# Patient Record
Sex: Female | Born: 1990 | Race: White | Hispanic: Yes | Marital: Single | Smoking: Never smoker
Health system: Southern US, Community
[De-identification: ages and names within clinical notes are randomized; demographics above are authoritative.]

## PROBLEM LIST (undated history)

## (undated) DIAGNOSIS — A6000 Herpesviral infection of urogenital system, unspecified: Secondary | ICD-10-CM

## (undated) DIAGNOSIS — B009 Herpesviral infection, unspecified: Secondary | ICD-10-CM

## (undated) HISTORY — DX: Herpesviral infection of urogenital system, unspecified: A60.00

## (undated) HISTORY — PX: NO PAST SURGERIES: SHX2092

## (undated) HISTORY — DX: Herpesviral infection, unspecified: B00.9

---

## 2009-12-13 LAB — SICKLE CELL SCREEN: Sickle Cell Screen: NORMAL

## 2009-12-14 ENCOUNTER — Ambulatory Visit (HOSPITAL_COMMUNITY): Admission: RE | Admit: 2009-12-14 | Discharge: 2009-12-14 | Payer: Self-pay | Admitting: Obstetrics & Gynecology

## 2010-01-17 ENCOUNTER — Inpatient Hospital Stay (HOSPITAL_COMMUNITY): Admission: AD | Admit: 2010-01-17 | Discharge: 2010-01-19 | Payer: Self-pay | Admitting: Obstetrics & Gynecology

## 2010-01-17 ENCOUNTER — Ambulatory Visit: Payer: Self-pay | Admitting: Family Medicine

## 2010-10-21 LAB — WET PREP, GENITAL
Trich, Wet Prep: NONE SEEN
Yeast Wet Prep HPF POC: NONE SEEN

## 2010-10-21 LAB — CBC
HCT: 38.4 % (ref 36.0–46.0)
MCV: 91.5 fL (ref 78.0–100.0)

## 2010-10-21 LAB — RPR: RPR Ser Ql: NONREACTIVE

## 2011-10-21 LAB — CYTOLOGY - PAP: Pap: NEGATIVE

## 2013-07-08 ENCOUNTER — Other Ambulatory Visit (HOSPITAL_COMMUNITY): Payer: Self-pay | Admitting: Nurse Practitioner

## 2013-07-08 DIAGNOSIS — Z3689 Encounter for other specified antenatal screening: Secondary | ICD-10-CM

## 2013-07-08 LAB — OB RESULTS CONSOLE HEPATITIS B SURFACE ANTIGEN: Hepatitis B Surface Ag: NEGATIVE

## 2013-07-08 LAB — OB RESULTS CONSOLE GC/CHLAMYDIA: Gonorrhea: NEGATIVE

## 2013-07-08 LAB — OB RESULTS CONSOLE ANTIBODY SCREEN: Antibody Screen: NEGATIVE

## 2013-07-08 LAB — OB RESULTS CONSOLE RUBELLA ANTIBODY, IGM: RUBELLA: IMMUNE

## 2013-07-08 LAB — OB RESULTS CONSOLE HIV ANTIBODY (ROUTINE TESTING): HIV: NONREACTIVE

## 2013-07-08 LAB — GLUCOSE, 1 HOUR: Glucose, 1 hour: 57

## 2013-07-08 LAB — CULTURE, OB URINE: Urine Culture, OB: NEGATIVE

## 2013-07-08 LAB — OB RESULTS CONSOLE HGB/HCT, BLOOD: HCT: 41 %

## 2013-07-15 ENCOUNTER — Encounter: Payer: Self-pay | Admitting: Obstetrics and Gynecology

## 2013-07-26 ENCOUNTER — Ambulatory Visit (HOSPITAL_COMMUNITY)
Admission: RE | Admit: 2013-07-26 | Discharge: 2013-07-26 | Disposition: A | Discharge status: Home / Self Care | Payer: Medicaid Other | Source: Ambulatory Visit | Attending: Nurse Practitioner | Admitting: Nurse Practitioner

## 2013-07-26 DIAGNOSIS — O365990 Maternal care for other known or suspected poor fetal growth, unspecified trimester, not applicable or unspecified: Secondary | ICD-10-CM | POA: Insufficient documentation

## 2013-07-26 DIAGNOSIS — Z3689 Encounter for other specified antenatal screening: Secondary | ICD-10-CM | POA: Insufficient documentation

## 2013-08-02 ENCOUNTER — Other Ambulatory Visit (HOSPITAL_COMMUNITY): Payer: Self-pay | Admitting: Physician Assistant

## 2013-08-02 DIAGNOSIS — Z3689 Encounter for other specified antenatal screening: Secondary | ICD-10-CM

## 2013-08-02 LAB — OB RESULTS CONSOLE GC/CHLAMYDIA
CHLAMYDIA, DNA PROBE: NEGATIVE
Chlamydia: NEGATIVE
Gonorrhea: NEGATIVE

## 2013-08-05 NOTE — L&D Delivery Note (Signed)
Delivery Note Pt pushed well and at 6:38 AM a viable female was delivered via Vaginal, Spontaneous Delivery (Presentation: ; Occiput Anterior).  APGAR: 9, 9; weight: pending.  Infant dried and lifted to pt's abd. Cord clamped and cut by FOB. Hospital lab blood sample collected from cord.  Placenta status: Intact, Spontaneous.  Cord: 3 vessels  Anesthesia: None  Episiotomy: None Lacerations: None Est. Blood Loss (mL): 300  Mom to postpartum.  Baby to Couplet care / Skin to Skin.  Cam HaiSHAW, Averie Meiner CNM 01/15/2014, 7:17 AM

## 2013-08-05 NOTE — L&D Delivery Note (Signed)
Attestation of Attending Supervision of Advanced Practitioner (PA/CNM/NP): Evaluation and management procedures were performed by the Advanced Practitioner under my supervision and collaboration.  I have reviewed the Advanced Practitioner's note and chart, and I agree with the management and plan.  Dayton Kenley, MD, FACOG Attending Obstetrician & Gynecologist Faculty Practice, [facility]  

## 2013-08-09 ENCOUNTER — Encounter: Payer: Self-pay | Admitting: *Deleted

## 2013-08-18 ENCOUNTER — Encounter: Payer: Self-pay | Admitting: General Practice

## 2013-08-19 ENCOUNTER — Encounter: Payer: Self-pay | Admitting: Family Medicine

## 2013-08-19 ENCOUNTER — Ambulatory Visit (INDEPENDENT_AMBULATORY_CARE_PROVIDER_SITE_OTHER): Payer: Medicaid Other | Admitting: Family Medicine

## 2013-08-19 VITALS — BP 104/56 | Temp 97.8°F | Wt 91.1 lb

## 2013-08-19 DIAGNOSIS — O0990 Supervision of high risk pregnancy, unspecified, unspecified trimester: Secondary | ICD-10-CM | POA: Insufficient documentation

## 2013-08-19 DIAGNOSIS — O09299 Supervision of pregnancy with other poor reproductive or obstetric history, unspecified trimester: Secondary | ICD-10-CM | POA: Insufficient documentation

## 2013-08-19 DIAGNOSIS — Z3490 Encounter for supervision of normal pregnancy, unspecified, unspecified trimester: Secondary | ICD-10-CM | POA: Insufficient documentation

## 2013-08-19 LAB — POCT URINALYSIS DIP (DEVICE)
BILIRUBIN URINE: NEGATIVE
Glucose, UA: NEGATIVE mg/dL
HGB URINE DIPSTICK: NEGATIVE
Ketones, ur: NEGATIVE mg/dL
Nitrite: NEGATIVE
Protein, ur: NEGATIVE mg/dL
Specific Gravity, Urine: 1.025 (ref 1.005–1.030)
UROBILINOGEN UA: 0.2 mg/dL (ref 0.0–1.0)
pH: 6 (ref 5.0–8.0)

## 2013-08-19 NOTE — Progress Notes (Signed)
Pulse- 66 New ob packet given [facility]

## 2013-08-19 NOTE — Patient Instructions (Signed)
Second Trimester of Pregnancy The second trimester is from week 13 through week 28, months 4 through 6. The second trimester is often a time when you feel your best. Your body has also adjusted to being pregnant, and you begin to feel better physically. Usually, morning sickness has lessened or quit completely, you may have more energy, and you may have an increase in appetite. The second trimester is also a time when the fetus is growing rapidly. At the end of the sixth month, the fetus is about 9 inches long and weighs about 1 pounds. You will likely begin to feel the baby move (quickening) between 18 and 20 weeks of the pregnancy. BODY CHANGES Your body goes through many changes during pregnancy. The changes vary from woman to woman.   Your weight will continue to increase. You will notice your lower abdomen bulging out.  You may begin to get stretch marks on your hips, abdomen, and breasts.  You may develop headaches that can be relieved by medicines approved by your caregiver.  You may urinate more often because the fetus is pressing on your bladder.  You may develop or continue to have heartburn as a result of your pregnancy.  You may develop constipation because certain hormones are causing the muscles that push waste through your intestines to slow down.  You may develop hemorrhoids or swollen, bulging veins (varicose veins).  You may have back pain because of the weight gain and pregnancy hormones relaxing your joints between the bones in your pelvis and as a result of a shift in weight and the muscles that support your balance.  Your breasts will continue to grow and be tender.  Your gums may bleed and may be sensitive to brushing and flossing.  Dark spots or blotches (chloasma, mask of pregnancy) may develop on your face. This will likely fade after the baby is born.  A dark line from your belly button to the pubic area (linea nigra) may appear. This will likely fade after  the baby is born. WHAT TO EXPECT AT YOUR PRENATAL VISITS During a routine prenatal visit:  You will be weighed to make sure you and the fetus are growing normally.  Your blood pressure will be taken.  Your abdomen will be measured to track your baby's growth.  The fetal heartbeat will be listened to.  Any test results from the previous visit will be discussed. Your caregiver may ask you:  How you are feeling.  If you are feeling the baby move.  If you have had any abnormal symptoms, such as leaking fluid, bleeding, severe headaches, or abdominal cramping.  If you have any questions. Other tests that may be performed during your second trimester include:  Blood tests that check for:  Low iron levels (anemia).  Gestational diabetes (between 24 and 28 weeks).  Rh antibodies.  Urine tests to check for infections, diabetes, or protein in the urine.  An ultrasound to confirm the proper growth and development of the baby.  An amniocentesis to check for possible genetic problems.  Fetal screens for spina bifida and Down syndrome. HOME CARE INSTRUCTIONS   Avoid all smoking, herbs, alcohol, and unprescribed drugs. These chemicals affect the formation and growth of the baby.  Follow your caregiver's instructions regarding medicine use. There are medicines that are either safe or unsafe to take during pregnancy.  Exercise only as directed by your caregiver. Experiencing uterine cramps is a good sign to stop exercising.  Continue to eat regular,   healthy meals.  Wear a good support bra for breast tenderness.  Do not use hot tubs, steam rooms, or saunas.  Wear your seat belt at all times when driving.  Avoid raw meat, uncooked cheese, cat litter boxes, and soil used by cats. These carry germs that can cause birth defects in the baby.  Take your prenatal vitamins.  Try taking a stool softener (if your caregiver approves) if you develop constipation. Eat more high-fiber  foods, such as fresh vegetables or fruit and whole grains. Drink plenty of fluids to keep your urine clear or pale yellow.  Take warm sitz baths to soothe any pain or discomfort caused by hemorrhoids. Use hemorrhoid cream if your caregiver approves.  If you develop varicose veins, wear support hose. Elevate your feet for 15 minutes, 3 4 times a day. Limit salt in your diet.  Avoid heavy lifting, wear low heel shoes, and practice good posture.  Rest with your legs elevated if you have leg cramps or low back pain.  Visit your dentist if you have not gone yet during your pregnancy. Use a soft toothbrush to brush your teeth and be gentle when you floss.  A sexual relationship may be continued unless your caregiver directs you otherwise.  Continue to go to all your prenatal visits as directed by your caregiver. SEEK MEDICAL CARE IF:   You have dizziness.  You have mild pelvic cramps, pelvic pressure, or nagging pain in the abdominal area.  You have persistent nausea, vomiting, or diarrhea.  You have a bad smelling vaginal discharge.  You have pain with urination. SEEK IMMEDIATE MEDICAL CARE IF:   You have a fever.  You are leaking fluid from your vagina.  You have spotting or bleeding from your vagina.  You have severe abdominal cramping or pain.  You have rapid weight gain or loss.  You have shortness of breath with chest pain.  You notice sudden or extreme swelling of your face, hands, ankles, feet, or legs.  You have not felt your baby move in over an hour.  You have severe headaches that do not go away with medicine.  You have vision changes. Document Released: 07/16/2001 Document Revised: 03/24/2013 Document Reviewed: 09/22/2012 [employer] Patient Information 2014 [employer].  Breastfeeding Deciding to breastfeed is one of the best choices you can make for you and your baby. A change in hormones during pregnancy causes your breast tissue to grow and increases the  number and size of your milk ducts. These hormones also allow proteins, sugars, and fats from your blood supply to make breast milk in your milk-producing glands. Hormones prevent breast milk from being released before your baby is born as well as prompt milk flow after birth. Once breastfeeding has begun, thoughts of your baby, as well as his or her sucking or crying, can stimulate the release of milk from your milk-producing glands.  BENEFITS OF BREASTFEEDING For Your Baby  Your first milk (colostrum) helps your baby's digestive system function better.   There are antibodies in your milk that help your baby fight off infections.   Your baby has a lower incidence of asthma, allergies, and sudden infant death syndrome.   The nutrients in breast milk are better for your baby than infant formulas and are designed uniquely for your baby's needs.   Breast milk improves your baby's brain development.   Your baby is less likely to develop other conditions, such as childhood obesity, asthma, or type 2 diabetes mellitus.  For   You   Breastfeeding helps to create a very special bond between you and your baby.   Breastfeeding is convenient. Breast milk is always available at the correct temperature and costs nothing.   Breastfeeding helps to burn calories and helps you lose the weight gained during pregnancy.   Breastfeeding makes your uterus contract to its prepregnancy size faster and slows bleeding (lochia) after you give birth.   Breastfeeding helps to lower your risk of developing type 2 diabetes mellitus, osteoporosis, and breast or ovarian cancer later in life. SIGNS THAT YOUR BABY IS HUNGRY Early Signs of Hunger  Increased alertness or activity.  Stretching.  Movement of the head from side to side.  Movement of the head and opening of the mouth when the corner of the mouth or cheek is stroked (rooting).  Increased sucking sounds, smacking lips, cooing, sighing, or  squeaking.  Hand-to-mouth movements.  Increased sucking of fingers or hands. Late Signs of Hunger  Fussing.  Intermittent crying. Extreme Signs of Hunger Signs of extreme hunger will require calming and consoling before your baby will be able to breastfeed successfully. Do not wait for the following signs of extreme hunger to occur before you initiate breastfeeding:   Restlessness.  A loud, strong cry.   Screaming. BREASTFEEDING BASICS Breastfeeding Initiation  Find a comfortable place to sit or lie down, with your neck and back well supported.  Place a pillow or rolled up blanket under your baby to bring him or her to the level of your breast (if you are seated). Nursing pillows are specially designed to help support your arms and your baby while you breastfeed.  Make sure that your baby's abdomen is facing your abdomen.   Gently massage your breast. With your fingertips, massage from your chest wall toward your nipple in a circular motion. This encourages milk flow. You may need to continue this action during the feeding if your milk flows slowly.  Support your breast with 4 fingers underneath and your thumb above your nipple. Make sure your fingers are well away from your nipple and your baby's mouth.   Stroke your baby's lips gently with your finger or nipple.   When your baby's mouth is open wide enough, quickly bring your baby to your breast, placing your entire nipple and as much of the colored area around your nipple (areola) as possible into your baby's mouth.   More areola should be visible above your baby's upper lip than below the lower lip.   Your baby's tongue should be between his or her lower gum and your breast.   Ensure that your baby's mouth is correctly positioned around your nipple (latched). Your baby's lips should create a seal on your breast and be turned out (everted).  It is common for your baby to suck about 2 3 minutes in order to start the  flow of breast milk. Latching Teaching your baby how to latch on to your breast properly is very important. An improper latch can cause nipple pain and decreased milk supply for you and poor weight gain in your baby. Also, if your baby is not latched onto your nipple properly, he or she may swallow some air during feeding. This can make your baby fussy. Burping your baby when you switch breasts during the feeding can help to get rid of the air. However, teaching your baby to latch on properly is still the best way to prevent fussiness from swallowing air while breastfeeding. Signs that your baby has   successfully latched on to your nipple:    Silent tugging or silent sucking, without causing you pain.   Swallowing heard between every 3 4 sucks.    Muscle movement above and in front of his or her ears while sucking.  Signs that your baby has not successfully latched on to nipple:   Sucking sounds or smacking sounds from your baby while breastfeeding.  Nipple pain. If you think your baby has not latched on correctly, slip your finger into the corner of your baby's mouth to break the suction and place it between your baby's gums. Attempt breastfeeding initiation again. Signs of Successful Breastfeeding Signs from your baby:   A gradual decrease in the number of sucks or complete cessation of sucking.   Falling asleep.   Relaxation of his or her body.   Retention of a small amount of milk in his or her mouth.   Letting go of your breast by himself or herself. Signs from you:  Breasts that have increased in firmness, weight, and size 1 3 hours after feeding.   Breasts that are softer immediately after breastfeeding.  Increased milk volume, as well as a change in milk consistency and color by the 5th day of breastfeeding.   Nipples that are not sore, cracked, or bleeding. Signs That Your Baby is Getting Enough Milk  Wetting at least 3 diapers in a 24-hour period. The urine  should be clear and pale yellow by age 5 days.  At least 3 stools in a 24-hour period by age 5 days. The stool should be soft and yellow.  At least 3 stools in a 24-hour period by age 7 days. The stool should be seedy and yellow.  No loss of weight greater than 10% of birth weight during the first 3 days of age.  Average weight gain of 4 7 ounces (120 210 mL) per week after age 4 days.  Consistent daily weight gain by age 5 days, without weight loss after the age of 2 weeks. After a feeding, your baby may spit up a small amount. This is common. BREASTFEEDING FREQUENCY AND DURATION Frequent feeding will help you make more milk and can prevent sore nipples and breast engorgement. Breastfeed when you feel the need to reduce the fullness of your breasts or when your baby shows signs of hunger. This is called "breastfeeding on demand." Avoid introducing a pacifier to your baby while you are working to establish breastfeeding (the first 4 6 weeks after your baby is born). After this time you may choose to use a pacifier. Research has shown that pacifier use during the first year of a baby's life decreases the risk of sudden infant death syndrome (SIDS). Allow your baby to feed on each breast as long as he or she wants. Breastfeed until your baby is finished feeding. When your baby unlatches or falls asleep while feeding from the first breast, offer the second breast. Because newborns are often sleepy in the first few weeks of life, you may need to awaken your baby to get him or her to feed. Breastfeeding times will vary from baby to baby. However, the following rules can serve as a guide to help you ensure that your baby is properly fed:  Newborns (babies 4 weeks of age or younger) may breastfeed every 1 3 hours.  Newborns should not go longer than 3 hours during the day or 5 hours during the night without breastfeeding.  You should breastfeed your baby a minimum of   8 times in a 24-hour period until  you begin to introduce solid foods to your baby at around 6 months of age. BREAST MILK PUMPING Pumping and storing breast milk allows you to ensure that your baby is exclusively fed your breast milk, even at times when you are unable to breastfeed. This is especially important if you are going back to work while you are still breastfeeding or when you are not able to be present during feedings. Your lactation consultant can give you guidelines on how long it is safe to store breast milk.  A breast pump is a machine that allows you to pump milk from your breast into a sterile bottle. The pumped breast milk can then be stored in a refrigerator or freezer. Some breast pumps are operated by hand, while others use electricity. Ask your lactation consultant which type will work best for you. Breast pumps can be purchased, but some hospitals and breastfeeding support groups lease breast pumps on a monthly basis. A lactation consultant can teach you how to hand express breast milk, if you prefer not to use a pump.  CARING FOR YOUR BREASTS WHILE YOU BREASTFEED Nipples can become dry, cracked, and sore while breastfeeding. The following recommendations can help keep your breasts moisturized and healthy:  Avoid using soap on your nipples.   Wear a supportive bra. Although not required, special nursing bras and tank tops are designed to allow access to your breasts for breastfeeding without taking off your entire bra or top. Avoid wearing underwire style bras or extremely tight bras.  Air dry your nipples for 3 4minutes after each feeding.   Use only cotton bra pads to absorb leaked breast milk. Leaking of breast milk between feedings is normal.   Use lanolin on your nipples after breastfeeding. Lanolin helps to maintain your skin's normal moisture barrier. If you use pure lanolin you do not need to wash it off before feeding your baby again. Pure lanolin is not toxic to your baby. You may also hand express a  few drops of breast milk and gently massage that milk into your nipples and allow the milk to air dry. In the first few weeks after giving birth, some women experience extremely full breasts (engorgement). Engorgement can make your breasts feel heavy, warm, and tender to the touch. Engorgement peaks within 3 5 days after you give birth. The following recommendations can help ease engorgement:  Completely empty your breasts while breastfeeding or pumping. You may want to start by applying warm, moist heat (in the shower or with warm water-soaked hand towels) just before feeding or pumping. This increases circulation and helps the milk flow. If your baby does not completely empty your breasts while breastfeeding, pump any extra milk after he or she is finished.  Wear a snug bra (nursing or regular) or tank top for 1 2 days to signal your body to slightly decrease milk production.  Apply ice packs to your breasts, unless this is too uncomfortable for you.  Make sure that your baby is latched on and positioned properly while breastfeeding. If engorgement persists after 48 hours of following these recommendations, contact your health care provider or a lactation consultant. OVERALL HEALTH CARE RECOMMENDATIONS WHILE BREASTFEEDING  Eat healthy foods. Alternate between meals and snacks, eating 3 of each per day. Because what you eat affects your breast milk, some of the foods may make your baby more irritable than usual. Avoid eating these foods if you are sure that they are   negatively affecting your baby.  Drink milk, fruit juice, and water to satisfy your thirst (about 10 glasses a day).   Rest often, relax, and continue to take your prenatal vitamins to prevent fatigue, stress, and anemia.  Continue breast self-awareness checks.  Avoid chewing and smoking tobacco.  Avoid alcohol and drug use. Some medicines that may be harmful to your baby can pass through breast milk. It is important to ask your  health care provider before taking any medicine, including all over-the-counter and prescription medicine as well as vitamin and herbal supplements. It is possible to become pregnant while breastfeeding. If birth control is desired, ask your health care provider about options that will be safe for your baby. SEEK MEDICAL CARE IF:   You feel like you want to stop breastfeeding or have become frustrated with breastfeeding.  You have painful breasts or nipples.  Your nipples are cracked or bleeding.  Your breasts are red, tender, or warm.  You have a swollen area on either breast.  You have a fever or chills.  You have nausea or vomiting.  You have drainage other than breast milk from your nipples.  Your breasts do not become full before feedings by the 5th day after you give birth.  You feel sad and depressed.  Your baby is too sleepy to eat well.  Your baby is having trouble sleeping.   Your baby is wetting less than 3 diapers in a 24-hour period.  Your baby has less than 3 stools in a 24-hour period.  Your baby's skin or the white part of his or her eyes becomes yellow.   Your baby is not gaining weight by 5 days of age. SEEK IMMEDIATE MEDICAL CARE IF:   Your baby is overly tired (lethargic) and does not want to wake up and feed.  Your baby develops an unexplained fever. Document Released: 07/22/2005 Document Revised: 03/24/2013 Document Reviewed: 01/13/2013 [employer] Patient Information 2014 [employer].  

## 2013-08-19 NOTE — Progress Notes (Signed)
Nutrition note: 1st visit consult Pt has gained 4.1# @ 5726w5d, which is < expected so far. Pt reports eating 3 meals & 5-6 snacks/d (mostly fruit). Pt is taking PNV. Pt reports no N/V or heartburn. NKFA Pt is not doing any physical activity. Pt received verbal & written education on general nutrition during pregnancy. Provided handout on energy dense snacks & encouraged pt to include a protein source with all meals & snacks. Discussed wt gain goals of 25-40# or 1#/wk. Pt agrees to continue taking PNV and try to eat more energy dense snacks. Pt has WIC and plans to BF. F/u in 4-6 wks Blondell RevealLaura Yaziel Brandon, MS, RD, LDN, [facility]

## 2013-08-19 NOTE — Progress Notes (Signed)
From [facility] for h/o SGA.--probably constitutional--mom is small   Quad screen today. F/u antomy scheduled for 08/27/13

## 2013-08-23 ENCOUNTER — Encounter: Payer: Self-pay | Admitting: Family Medicine

## 2013-08-23 LAB — AFP, QUAD SCREEN
AFP: 116 [IU]/mL
Age Alone: 1:1080 {titer}
CURR GEST AGE: 18.5 wks.days
HCG, Total: 19984 m[IU]/mL
INH: 172.1 pg/mL
INTERPRETATION-AFP: NEGATIVE
MOM FOR AFP: 1.91
MOM FOR HCG: 1
MoM for INH: 0.93
OPEN SPINA BIFIDA: NEGATIVE
TRI 18 SCR RISK EST: NEGATIVE
Trisomy 18 (Edward) Syndrome Interp.: 1:56100 {titer}
UE3 MOM: 0.78
UE3 VALUE: 1.2 ng/mL

## 2013-08-25 ENCOUNTER — Encounter: Payer: Self-pay | Admitting: *Deleted

## 2013-08-27 ENCOUNTER — Ambulatory Visit (HOSPITAL_COMMUNITY)
Admission: RE | Admit: 2013-08-27 | Discharge: 2013-08-27 | Disposition: A | Discharge status: Home / Self Care | Payer: Medicaid Other | Source: Ambulatory Visit | Attending: Physician Assistant | Admitting: Physician Assistant

## 2013-08-27 DIAGNOSIS — Z3689 Encounter for other specified antenatal screening: Secondary | ICD-10-CM | POA: Insufficient documentation

## 2013-09-16 ENCOUNTER — Ambulatory Visit (INDEPENDENT_AMBULATORY_CARE_PROVIDER_SITE_OTHER): Payer: Medicaid Other | Admitting: Obstetrics and Gynecology

## 2013-09-16 ENCOUNTER — Encounter: Payer: Self-pay | Admitting: Obstetrics and Gynecology

## 2013-09-16 VITALS — BP 92/50 | Temp 97.8°F | Wt 93.9 lb

## 2013-09-16 DIAGNOSIS — O09299 Supervision of pregnancy with other poor reproductive or obstetric history, unspecified trimester: Secondary | ICD-10-CM

## 2013-09-16 DIAGNOSIS — O0990 Supervision of high risk pregnancy, unspecified, unspecified trimester: Secondary | ICD-10-CM

## 2013-09-16 LAB — POCT URINALYSIS DIP (DEVICE)
Bilirubin Urine: NEGATIVE
Glucose, UA: NEGATIVE mg/dL
KETONES UR: NEGATIVE mg/dL
Nitrite: NEGATIVE
PH: 6 (ref 5.0–8.0)
Protein, ur: NEGATIVE mg/dL
Specific Gravity, Urine: 1.025 (ref 1.005–1.030)
Urobilinogen, UA: 0.2 mg/dL (ref 0.0–1.0)

## 2013-09-16 NOTE — Progress Notes (Signed)
Nutrition note: f/u re: poor wt gain Pt has gained 6.9# @ 1740w5d, which is < expected & pt gained 2.8# in the past 4 wks, which is < recommended (1#/wk). Pt reports eating 3 meals & 4-5 snacks/d. Pt reports snacks are still mostly fruit. Pt stated she likes cheese & nuts but does not like to eat anything with her fruit. Pt is taking PNV. Pt reports still no N/V or heartburn. Reviewed "Small frequent meals" and "Gain more weight" handouts & reviewed protein sources. Encouraged pt to eat at least 6 small meals, all including a protein source & add fruit in addition to the 6 meals. Suggested pt get [employer] to add to her milk (pt drinks whole milk) to add calories & protein. Suggested making a snack mix with nuts, pretzels, dried fruit, m&m's, etc. Discussed wt gain goals of 25-40# or 1#/wk. Goal is to gain 4# by next visit in 4 wks. Pt agrees to try to eat more & include protein sources. F/u in 4-6 wks Blondell RevealLaura Kandis Henry, MS, RD, LDN, [facility]

## 2013-09-16 NOTE — Patient Instructions (Signed)
Eleccin del mtodo anticonceptivo (Contraception Choices) La anticoncepcin (control de la natalidad) es el uso de cualquier mtodo o dispositivo para evitar el embarazo. A continuacin se indican algunos de esos mtodos. MTODOS HORMONALES   El Implante contraconceptivo consiste en un tubo plstico delgado que contiene la hormona progesterona. No contiene estrgenos. El mdico inserta el tubo en la parte interna del brazo. El tubo puede permanecer en el lugar durante 3 aos. Despus de los 3 aos debe retirarse. El implante impide que los ovarios liberen vulos (ovulacin), espesa el moco cervical, lo que evita que los espermatozoides ingresen al tero y hace ms delgada la membrana que cubre el interior del tero.  Inyecciones de progesterona sola: las administra el mdico cada 3 meses para evitar el embarazo. La progesterona sinttica impide que los ovarios liberen vulos. Tambin hacen que el moco cervical se espese y modifique el tejido de recubrimiento interno del tero. Esto hace ms difcil que los espermatozoides sobrevivan en el tero.  Las pldoras anticonceptivas contienen estrgenos y progesterona. Su funcin es evitar que los ovarios liberen vulos (ovulacin). Las hormonas de los anticonceptivos orales hacen que el moco cervical se haga ms espeso, lo que evita que el esperma ingrese al tero. Las pldoras anticonceptivas son recetadas por el mdico.Tambin se utilizan para tratar los perodos menstruales abundantes.  Minipldora: este tipo de pldora anticonceptiva contiene slo hormona progesterona. Deben tomarse todos los das del mes y debe recetarlas el mdico.  El parche de control de natalidad: contiene hormonas similares a las que contienen las pldoras anticonceptivas. Deben cambiarse una vez por semana y se utilizan bajo prescripcin mdica.  Anillo vaginal: contiene hormonas similares a las que contienen las pldoras anticonceptivas. Se deja colocado durante tres semanas,  se lo retira durante 1 semana y luego se coloca uno nuevo. La paciente debe sentirse cmoda al insertar y retirar el anillo de la vagina.Es necesaria la prescripcin mdica.  Anticonceptivos de emergencia: son mtodos para evitar un embarazo despus de una relacin sexual sin proteccin. Esta pldora puede tomarse inmediatamente despus de tener relaciones sexuales o hasta 5 das de haber tenido sexo sin proteccin. Es ms efectiva si se toma poco tiempo despus de la relacin sexual. Los anticonceptivos de emergencia estn disponibles sin prescripcin mdica. Consltelo con su farmacutico. No use los anticonceptivos de emergencia como nico mtodo anticonceptivo. MTODOS DE BARRERA   Condn masculino: es una vaina delgada (ltex o goma) que se coloca cubriendo al pene durante el acto sexual. Puede usarse con espermicida para aumentar la efectividad.  Condn femenino. Es una funda delicada y blanda que se adapta holgadamente a la vagina antes de las relaciones sexuales.  Diafragma: es una barrera de ltex redonda y suave que debe ser recomendado por un profesional. Se inserta en la vagina, junto con un gel espermicida. Debe insertarse antes de tener relaciones sexuales. Debe dejar el diafragma colocado en la vagina durante 6 a 8 horas despus de la relacin sexual.  Capuchn cervical: es una barrera de ltex o taza plstica redonda y suave que cubre el cuello del tero y debe ser colocada por un mdico. Puede dejarlo colocado en la vagina hasta 48 horas despus de las relaciones sexuales.  Esponja: es una pieza blanda y circular de espuma de poliuretano. Contiene un espermicida. Se inserta en la vagina despus de mojarla y antes de las relaciones sexuales.  Espermicidas: son sustancias qumicas que matan o bloquean al esperma y no lo dejan ingresar al cuello del tero y al tero. Vienen   en forma de cremas, geles, supositorios, espuma o comprimidos. No es necesario tener Emergency planning/management officerreceta mdica. Se insertan en  la vagina con un aplicador antes de Management consultanttener relaciones sexuales. El proceso debe repetirse cada vez que tiene relaciones sexuales. ANTICONCEPTIVOS INTRAUTERINOS  Dispositivo intrauterino (DIU) es un dispositivo en forma de T que se coloca en el tero durante el perodo menstrual, para Location managerevitar el embarazo. Hay dos tipos:  DIU de cobre: este tipo de DIU est recubierto con un alambre de cobre y se inserta dentro del tero. El cobre hace que el tero y las trompas de Falopio produzcan un liquido que Federated Department Storesdestruye los espermatozoides. Puede permanecer colocado durante 10 aos.  DIU con hormona: este tipo de DIU contiene la hormona progestina (progesterona sinttica). La hormona espesa el moco cervical y evita que los espermatozoides ingresen al tero y tambin afina la membrana que cubre el tero para evitar la implantacin del vulo fertilizado. La hormona debilita o destruye los espermatozoides que ingresan al tero. Puede Geneticist, molecularpermanecer en el lugar durante 3 5 aos, segn el tipo de DIU que se utilice. MTODOS ANTICONCEPTIVOS PERMANENTES  Ligadura de trompas en la mujer: se realiza sellando, atando u obstruyendo quirrgicamente las trompas de Falopio lo que impide que el vulo descienda hacia el tero.  Esterilizacin histeroscpica: Implica la colocacin de un pequeo espiral o la insercin en cada trompa de Falopio. El mdico utiliza una tcnica llamada histeroscopa para Primary school teacherrealizar este procedimiento. El dispositivo produce la formacin de tejido Designer, television/film setcicatrizal. Esto da como resultado una obstruccin permanente de las trompas de Falopio, de modo que la esperma no pueda fertilizar el vulo. Demora alrededor de 3 meses despus del procedimiento hasta que el conducto se obstruye. Tendr que usar otro mtodo anticonceptivo durante al menos 3 meses.  Esterilizacin masculina: se realiza ligando los conductos por los que pasan los espermatozoides (vasectoma).Esto impide que el esperma ingrese a la vagina durante el acto  sexual. Luego del procedimiento, el hombre puede eyacular lquido (semen). MTODOS DE PLANIFICACIN NATURAL  Planificacin familiar natural: consiste en no Management consultanttener relaciones sexuales o usar un mtodo de barrera (condn, Hamerdiafragma, capuchn cervical) en los [employer] Solutionsdas que la mujer podra quedar [place]goembarazada.  Mtodo de calendario: consiste en el seguimiento de la duracin de cada ciclo menstrual y la identificacin de los perodos frtiles.  Mtodo de ovulacin: Paramedicconsiste en evitar las relaciones sexuales durante la ovulacin.  Mtodo sintotrmico: Advertising copywriterconsiste en evitar las relaciones sexuales en la poca en la que se est ovulando, utilizando un termmetro y tendiendo en cuenta los sntomas de la ovulacin.  Mtodo postovulacin: Youth workerconsiste en planificar las relaciones sexuales para despus de haber ovulado. Independientemente del tipo o mtodo anticonceptivo que usted elija, es importante que use condones para protegerse contra las infecciones de transmisin sexual (ETS). Hable con su mdico con respecto a qu mtodo anticonceptivo es el ms apropiado para usted. Document Released: 07/22/2005 Document Revised: 03/24/2013 [facility] CenterExitCare Patient Information 2014 [place], [place]. Contraception Choices Contraception (birth control) is the use of any methods or devices to prevent pregnancy. Below are some methods to help avoid pregnancy. HORMONAL METHODS   Contraceptive implant This is a thin, plastic tube containing progesterone hormone. It does not contain estrogen hormone. Your health care provider inserts the tube in the inner part of the upper arm. The tube can remain in place for up to 3 years. After 3 years, the implant must be removed. The implant prevents the ovaries from releasing an egg (ovulation), thickens the cervical mucus to prevent sperm from entering the  uterus, and thins the lining of the inside of the uterus.  Progesterone-only injections These injections are given every 3 months by your health care  provider to prevent pregnancy. This synthetic progesterone hormone stops the ovaries from releasing eggs. It also thickens cervical mucus and changes the uterine lining. This makes it harder for sperm to survive in the uterus.  Birth control pills These pills contain estrogen and progesterone hormone. They work by preventing the ovaries from releasing eggs (ovulation). They also cause the cervical mucus to thicken, preventing the sperm from entering the uterus. Birth control pills are prescribed by a health care provider.Birth control pills can also be used to treat heavy periods.  Minipill This type of birth control pill contains only the progesterone hormone. They are taken every day of each month and must be prescribed by your health care provider.  Birth control patch The patch contains hormones similar to those in birth control pills. It must be changed once a week and is prescribed by a health care provider.  Vaginal ring The ring contains hormones similar to those in birth control pills. It is left in the vagina for 3 weeks, removed for 1 week, and then a new one is put back in place. The patient must be comfortable inserting and removing the ring from the vagina.A health care provider's prescription is necessary.  Emergency contraception Emergency contraceptives prevent pregnancy after unprotected sexual intercourse. This pill can be taken right after sex or up to 5 days after unprotected sex. It is most effective the sooner you take the pills after having sexual intercourse. Most emergency contraceptive pills are available without a prescription. Check with your pharmacist. Do not use emergency contraception as your only form of birth control. BARRIER METHODS   Female condom This is a thin sheath (latex or rubber) that is worn over the penis during sexual intercourse. It can be used with spermicide to increase effectiveness.  Female condom. This is a soft, loose-fitting sheath that is put  into the vagina before sexual intercourse.  Diaphragm This is a soft, latex, dome-shaped barrier that must be fitted by a health care provider. It is inserted into the vagina, along with a spermicidal jelly. It is inserted before intercourse. The diaphragm should be left in the vagina for 6 to 8 hours after intercourse.  Cervical cap This is a round, soft, latex or plastic cup that fits over the cervix and must be fitted by a health care provider. The cap can be left in place for up to 48 hours after intercourse.  Sponge This is a soft, circular piece of polyurethane foam. The sponge has spermicide in it. It is inserted into the vagina after wetting it and before sexual intercourse.  Spermicides These are chemicals that kill or block sperm from entering the cervix and uterus. They come in the form of creams, jellies, suppositories, foam, or tablets. They do not require a prescription. They are inserted into the vagina with an applicator before having sexual intercourse. The process must be repeated every time you have sexual intercourse. INTRAUTERINE CONTRACEPTION  Intrauterine device (IUD) This is a T-shaped device that is put in a woman's uterus during a menstrual period to prevent pregnancy. There are 2 types:  Copper IUD This type of IUD is wrapped in copper wire and is placed inside the uterus. Copper makes the uterus and fallopian tubes produce a fluid that kills sperm. It can stay in place for 10 years.  Hormone IUD This type  of IUD contains the hormone progestin (synthetic progesterone). The hormone thickens the cervical mucus and prevents sperm from entering the uterus, and it also thins the uterine lining to prevent implantation of a fertilized egg. The hormone can weaken or kill the sperm that get into the uterus. It can stay in place for 3 5 years, depending on which type of IUD is used. PERMANENT METHODS OF CONTRACEPTION  Female tubal ligation This is when the woman's fallopian tubes  are surgically sealed, tied, or blocked to prevent the egg from traveling to the uterus.  Hysteroscopic sterilization This involves placing a small coil or insert into each fallopian tube. Your doctor uses a technique called hysteroscopy to do the procedure. The device causes scar tissue to form. This results in permanent blockage of the fallopian tubes, so the sperm cannot fertilize the egg. It takes about 3 months after the procedure for the tubes to become blocked. You must use another form of birth control for these 3 months.  Female sterilization This is when the female has the tubes that carry sperm tied off (vasectomy).This blocks sperm from entering the vagina during sexual intercourse. After the procedure, the man can still ejaculate fluid (semen). NATURAL PLANNING METHODS  Natural family planning This is not having sexual intercourse or using a barrier method (condom, diaphragm, cervical cap) on days the woman could become pregnant.  Calendar method This is keeping track of the length of each menstrual cycle and identifying when you are fertile.  Ovulation method This is avoiding sexual intercourse during ovulation.  Symptothermal method This is avoiding sexual intercourse during ovulation, using a thermometer and ovulation symptoms.  Post ovulation method This is timing sexual intercourse after you have ovulated. Regardless of which type or method of contraception you choose, it is important that you use condoms to protect against the transmission of sexually transmitted infections (STIs). Talk with your health care provider about which form of contraception is most appropriate for you. Document Released: 07/22/2005 Document Revised: 03/24/2013 Document Reviewed: 01/14/2013 [facility] Patient Information 2014 [place], [place].

## 2013-09-16 NOTE — Progress Notes (Signed)
Patient is doing well without complaints. FM/PTL precautions reviewed. Discussed round ligament pains and difference with PTL. 1hr GCT and labs at next visit

## 2013-09-16 NOTE — Progress Notes (Signed)
P=  C/o same pain she was having, contractions rarely- once a month

## 2013-09-18 LAB — CULTURE, OB URINE
COLONY COUNT: NO GROWTH
ORGANISM ID, BACTERIA: NO GROWTH

## 2013-10-09 ENCOUNTER — Encounter: Payer: Self-pay | Admitting: *Deleted

## 2013-10-14 ENCOUNTER — Ambulatory Visit (INDEPENDENT_AMBULATORY_CARE_PROVIDER_SITE_OTHER): Payer: Self-pay | Admitting: Obstetrics & Gynecology

## 2013-10-14 VITALS — BP 92/57 | Temp 98.0°F | Wt 98.7 lb

## 2013-10-14 DIAGNOSIS — O99891 Other specified diseases and conditions complicating pregnancy: Secondary | ICD-10-CM

## 2013-10-14 DIAGNOSIS — O9989 Other specified diseases and conditions complicating pregnancy, childbirth and the puerperium: Secondary | ICD-10-CM

## 2013-10-14 DIAGNOSIS — O09299 Supervision of pregnancy with other poor reproductive or obstetric history, unspecified trimester: Secondary | ICD-10-CM

## 2013-10-14 DIAGNOSIS — O0990 Supervision of high risk pregnancy, unspecified, unspecified trimester: Secondary | ICD-10-CM

## 2013-10-14 DIAGNOSIS — R8271 Bacteriuria: Secondary | ICD-10-CM

## 2013-10-14 LAB — CBC
HEMATOCRIT: 34.9 % — AB (ref 36.0–46.0)
HEMOGLOBIN: 12.3 g/dL (ref 12.0–15.0)
MCH: 31.6 pg (ref 26.0–34.0)
MCHC: 35.2 g/dL (ref 30.0–36.0)
MCV: 89.7 fL (ref 78.0–100.0)
PLATELETS: 290 10*3/uL (ref 150–400)
RBC: 3.89 MIL/uL (ref 3.87–5.11)
RDW: 13.3 % (ref 11.5–15.5)
WBC: 9.4 10*3/uL (ref 4.0–10.5)

## 2013-10-14 LAB — POCT URINALYSIS DIP (DEVICE)
BILIRUBIN URINE: NEGATIVE
GLUCOSE, UA: NEGATIVE mg/dL
Hgb urine dipstick: NEGATIVE
KETONES UR: NEGATIVE mg/dL
Nitrite: NEGATIVE
Protein, ur: NEGATIVE mg/dL
SPECIFIC GRAVITY, URINE: 1.02 (ref 1.005–1.030)
Urobilinogen, UA: 0.2 mg/dL (ref 0.0–1.0)
pH: 6.5 (ref 5.0–8.0)

## 2013-10-14 LAB — RPR

## 2013-10-14 MED ORDER — TETANUS-DIPHTH-ACELL PERTUSSIS 5-2.5-18.5 LF-MCG/0.5 IM SUSP: 0.5000 mL | Freq: Once | INTRAMUSCULAR | Status: DC

## 2013-10-14 NOTE — Progress Notes (Signed)
History IUGR.  Needs US growth in 3rd trimester.  Mod LE on UA--send culture.  Tdap next visit if not done today.

## 2013-10-14 NOTE — Progress Notes (Signed)
Pulse 76 1hr, CBC, RPR, HIV today. Pt reports pain and cramping in her right calf.  Refuses tdap.

## 2013-10-15 LAB — GLUCOSE TOLERANCE, 1 HOUR (50G) W/O FASTING: GLUCOSE 1 HOUR GTT: 63 mg/dL — AB (ref 70–140)

## 2013-10-15 LAB — HIV ANTIBODY (ROUTINE TESTING W REFLEX): HIV: NONREACTIVE

## 2013-10-16 ENCOUNTER — Encounter: Payer: Self-pay | Admitting: Obstetrics & Gynecology

## 2013-10-17 LAB — CULTURE, OB URINE

## 2013-10-26 ENCOUNTER — Other Ambulatory Visit: Payer: Self-pay | Admitting: Obstetrics & Gynecology

## 2013-10-26 MED ORDER — NITROFURANTOIN MONOHYD MACRO 100 MG PO CAPS: 100.0000 mg | ORAL_CAPSULE | Freq: Two times a day (BID) | ORAL | Status: DC

## 2013-10-26 NOTE — Progress Notes (Addendum)
E coli UTI.  Macrobid e prescribed.   3/25  1655  Called pt and left message that a prescription has been sent to her pharmacy for antibiotic to treat a bladder infection. Please obtain the medication and take as directed. She may call the clinic if she has any questions.

## 2013-11-04 ENCOUNTER — Ambulatory Visit (INDEPENDENT_AMBULATORY_CARE_PROVIDER_SITE_OTHER): Payer: Self-pay | Admitting: Family

## 2013-11-04 VITALS — BP 93/57 | Temp 97.4°F | Wt 99.5 lb

## 2013-11-04 DIAGNOSIS — Z23 Encounter for immunization: Secondary | ICD-10-CM

## 2013-11-04 DIAGNOSIS — O09299 Supervision of pregnancy with other poor reproductive or obstetric history, unspecified trimester: Secondary | ICD-10-CM

## 2013-11-04 DIAGNOSIS — O0990 Supervision of high risk pregnancy, unspecified, unspecified trimester: Secondary | ICD-10-CM

## 2013-11-04 LAB — POCT URINALYSIS DIP (DEVICE)
BILIRUBIN URINE: NEGATIVE
GLUCOSE, UA: NEGATIVE mg/dL
Hgb urine dipstick: NEGATIVE
Ketones, ur: NEGATIVE mg/dL
NITRITE: NEGATIVE
Protein, ur: NEGATIVE mg/dL
Specific Gravity, Urine: 1.025 (ref 1.005–1.030)
Urobilinogen, UA: 0.2 mg/dL (ref 0.0–1.0)
pH: 6.5 (ref 5.0–8.0)

## 2013-11-04 NOTE — Progress Notes (Signed)
OB U/S scheduled for 11/11/13 at 9:45 am.

## 2013-11-04 NOTE — Progress Notes (Signed)
P-[id number] 

## 2013-11-04 NOTE — Progress Notes (Signed)
Doing well no questions or concerns.  Growth ultrasound ordered.  Tdap today.

## 2013-11-11 ENCOUNTER — Ambulatory Visit (HOSPITAL_COMMUNITY)
Admission: RE | Admit: 2013-11-11 | Discharge: 2013-11-11 | Disposition: A | Discharge status: Home / Self Care | Payer: Medicaid Other | Source: Ambulatory Visit | Attending: Family | Admitting: Family

## 2013-11-11 DIAGNOSIS — O0990 Supervision of high risk pregnancy, unspecified, unspecified trimester: Secondary | ICD-10-CM

## 2013-11-11 DIAGNOSIS — O09299 Supervision of pregnancy with other poor reproductive or obstetric history, unspecified trimester: Secondary | ICD-10-CM | POA: Insufficient documentation

## 2013-11-16 ENCOUNTER — Encounter: Payer: Self-pay | Admitting: Family

## 2013-11-18 ENCOUNTER — Ambulatory Visit (INDEPENDENT_AMBULATORY_CARE_PROVIDER_SITE_OTHER): Payer: Self-pay | Admitting: Family

## 2013-11-18 VITALS — BP 110/50 | Temp 97.2°F | Wt 103.2 lb

## 2013-11-18 DIAGNOSIS — O9989 Other specified diseases and conditions complicating pregnancy, childbirth and the puerperium: Secondary | ICD-10-CM

## 2013-11-18 DIAGNOSIS — R8271 Bacteriuria: Secondary | ICD-10-CM

## 2013-11-18 DIAGNOSIS — O09299 Supervision of pregnancy with other poor reproductive or obstetric history, unspecified trimester: Secondary | ICD-10-CM

## 2013-11-18 DIAGNOSIS — O0990 Supervision of high risk pregnancy, unspecified, unspecified trimester: Secondary | ICD-10-CM

## 2013-11-18 DIAGNOSIS — O99891 Other specified diseases and conditions complicating pregnancy: Secondary | ICD-10-CM

## 2013-11-18 LAB — POCT URINALYSIS DIP (DEVICE)
BILIRUBIN URINE: NEGATIVE
GLUCOSE, UA: NEGATIVE mg/dL
Ketones, ur: NEGATIVE mg/dL
NITRITE: NEGATIVE
Protein, ur: NEGATIVE mg/dL
Specific Gravity, Urine: 1.02 (ref 1.005–1.030)
Urobilinogen, UA: 2 mg/dL — ABNORMAL HIGH (ref 0.0–1.0)
pH: 6 (ref 5.0–8.0)

## 2013-11-18 MED ORDER — NITROFURANTOIN MONOHYD MACRO 100 MG PO CAPS: 100.0000 mg | ORAL_CAPSULE | Freq: Two times a day (BID) | ORAL | Status: DC

## 2013-11-18 NOTE — Progress Notes (Signed)
P=80,

## 2013-11-18 NOTE — Progress Notes (Signed)
No questions or concerns.  Reviewed ultrasound results (44%ile growth); Large leuks in urine > no UTI symptoms > RX macrobid, urine culture sent.

## 2013-11-22 LAB — CULTURE, OB URINE

## 2013-12-02 ENCOUNTER — Ambulatory Visit (INDEPENDENT_AMBULATORY_CARE_PROVIDER_SITE_OTHER): Payer: Self-pay | Admitting: Family Medicine

## 2013-12-02 VITALS — BP 110/65 | HR 83 | Temp 97.0°F | Wt 104.6 lb

## 2013-12-02 DIAGNOSIS — O0990 Supervision of high risk pregnancy, unspecified, unspecified trimester: Secondary | ICD-10-CM

## 2013-12-02 DIAGNOSIS — O09299 Supervision of pregnancy with other poor reproductive or obstetric history, unspecified trimester: Secondary | ICD-10-CM

## 2013-12-02 LAB — POCT URINALYSIS DIP (DEVICE)
Bilirubin Urine: NEGATIVE
Glucose, UA: NEGATIVE mg/dL
Ketones, ur: NEGATIVE mg/dL
NITRITE: NEGATIVE
PROTEIN: NEGATIVE mg/dL
Specific Gravity, Urine: 1.02 (ref 1.005–1.030)
Urobilinogen, UA: 2 mg/dL — ABNORMAL HIGH (ref 0.0–1.0)
pH: 7.5 (ref 5.0–8.0)

## 2013-12-02 NOTE — Progress Notes (Signed)
S: 23 yo G2P1001 @ 1837w5d - no ctx, lof, vb. +FM - took macrobid for UTI - wants to know if she can have sex  O: see flowsheet  A/P - doing well.  - reassured intercourse was safe - US with appropriate growth- cont to monitor fundal height - large leuks in urine persist--> no symptoms--> send for cx and retreat if positive.

## 2013-12-02 NOTE — Patient Instructions (Signed)
Third Trimester of Pregnancy  The third trimester is from week 29 through week 42, months 7 through 9. The third trimester is a time when the fetus is growing rapidly. At the end of the ninth month, the fetus is about 20 inches in length and weighs 6 10 pounds.   BODY CHANGES  Your body goes through many changes during pregnancy. The changes vary from woman to woman.    Your weight will continue to increase. You can expect to gain 25 35 pounds (11 16 kg) by the end of the pregnancy.   You may begin to get stretch marks on your hips, abdomen, and breasts.   You may urinate more often because the fetus is moving lower into your pelvis and pressing on your bladder.   You may develop or continue to have heartburn as a result of your pregnancy.   You may develop constipation because certain hormones are causing the muscles that push waste through your intestines to slow down.   You may develop hemorrhoids or swollen, bulging veins (varicose veins).   You may have pelvic pain because of the weight gain and pregnancy hormones relaxing your joints between the bones in your pelvis. Back aches may result from over exertion of the muscles supporting your posture.   Your breasts will continue to grow and be tender. A yellow discharge may leak from your breasts called colostrum.   Your belly button may stick out.   You may feel short of breath because of your expanding uterus.   You may notice the fetus "dropping," or moving lower in your abdomen.   You may have a bloody mucus discharge. This usually occurs a few days to a week before labor begins.   Your cervix becomes thin and soft (effaced) near your due date.  WHAT TO EXPECT AT YOUR PRENATAL EXAMS   You will have prenatal exams every 2 weeks until week 36. Then, you will have weekly prenatal exams. During a routine prenatal visit:   You will be weighed to make sure you and the fetus are growing normally.   Your blood pressure is taken.   Your abdomen will be  measured to track your baby's growth.   The fetal heartbeat will be listened to.   Any test results from the previous visit will be discussed.   You may have a cervical check near your due date to see if you have effaced.  At around 36 weeks, your caregiver will check your cervix. At the same time, your caregiver will also perform a test on the secretions of the vaginal tissue. This test is to determine if a type of bacteria, Group B streptococcus, is present. Your caregiver will explain this further.  Your caregiver may ask you:   What your birth plan is.   How you are feeling.   If you are feeling the baby move.   If you have had any abnormal symptoms, such as leaking fluid, bleeding, severe headaches, or abdominal cramping.   If you have any questions.  Other tests or screenings that may be performed during your third trimester include:   Blood tests that check for low iron levels (anemia).   Fetal testing to check the health, activity level, and growth of the fetus. Testing is done if you have certain medical conditions or if there are problems during the pregnancy.  FALSE LABOR  You may feel small, irregular contractions that eventually go away. These are called Braxton Hicks contractions, or   false labor. Contractions may last for hours, days, or even weeks before true labor sets in. If contractions come at regular intervals, intensify, or become painful, it is best to be seen by your caregiver.   SIGNS OF LABOR    Menstrual-like cramps.   Contractions that are 5 minutes apart or less.   Contractions that start on the top of the uterus and spread down to the lower abdomen and back.   A sense of increased pelvic pressure or back pain.   A watery or bloody mucus discharge that comes from the vagina.  If you have any of these signs before the 37th week of pregnancy, call your caregiver right away. You need to go to the hospital to get checked immediately.  HOME CARE INSTRUCTIONS    Avoid all  smoking, herbs, alcohol, and unprescribed drugs. These chemicals affect the formation and growth of the baby.   Follow your caregiver's instructions regarding medicine use. There are medicines that are either safe or unsafe to take during pregnancy.   Exercise only as directed by your caregiver. Experiencing uterine cramps is a good sign to stop exercising.   Continue to eat regular, healthy meals.   Wear a good support bra for breast tenderness.   Do not use hot tubs, steam rooms, or saunas.   Wear your seat belt at all times when driving.   Avoid raw meat, uncooked cheese, cat litter boxes, and soil used by cats. These carry germs that can cause birth defects in the baby.   Take your prenatal vitamins.   Try taking a stool softener (if your caregiver approves) if you develop constipation. Eat more high-fiber foods, such as fresh vegetables or fruit and whole grains. Drink plenty of fluids to keep your urine clear or pale yellow.   Take warm sitz baths to soothe any pain or discomfort caused by hemorrhoids. Use hemorrhoid cream if your caregiver approves.   If you develop varicose veins, wear support hose. Elevate your feet for 15 minutes, 3 4 times a day. Limit salt in your diet.   Avoid heavy lifting, wear low heal shoes, and practice good posture.   Rest a lot with your legs elevated if you have leg cramps or low back pain.   Visit your dentist if you have not gone during your pregnancy. Use a soft toothbrush to brush your teeth and be gentle when you floss.   A sexual relationship may be continued unless your caregiver directs you otherwise.   Do not travel far distances unless it is absolutely necessary and only with the approval of your caregiver.   Take prenatal classes to understand, practice, and ask questions about the labor and delivery.   Make a trial run to the hospital.   Pack your hospital bag.   Prepare the baby's nursery.   Continue to go to all your prenatal visits as directed  by your caregiver.  SEEK MEDICAL CARE IF:   You are unsure if you are in labor or if your water has broken.   You have dizziness.   You have mild pelvic cramps, pelvic pressure, or nagging pain in your abdominal area.   You have persistent nausea, vomiting, or diarrhea.   You have a bad smelling vaginal discharge.   You have pain with urination.  SEEK IMMEDIATE MEDICAL CARE IF:    You have a fever.   You are leaking fluid from your vagina.   You have spotting or bleeding from your vagina.     You have severe abdominal cramping or pain.   You have rapid weight loss or gain.   You have shortness of breath with chest pain.   You notice sudden or extreme swelling of your face, hands, ankles, feet, or legs.   You have not felt your baby move in over an hour.   You have severe headaches that do not go away with medicine.   You have vision changes.  Document Released: 07/16/2001 Document Revised: 03/24/2013 Document Reviewed: 09/22/2012  [employer] Patient Information 2014 [employer].

## 2013-12-03 LAB — CULTURE, OB URINE

## 2013-12-16 ENCOUNTER — Ambulatory Visit (INDEPENDENT_AMBULATORY_CARE_PROVIDER_SITE_OTHER): Payer: Self-pay | Admitting: Obstetrics & Gynecology

## 2013-12-16 VITALS — BP 97/62 | HR 83 | Wt 106.7 lb

## 2013-12-16 DIAGNOSIS — R8271 Bacteriuria: Secondary | ICD-10-CM

## 2013-12-16 DIAGNOSIS — O09299 Supervision of pregnancy with other poor reproductive or obstetric history, unspecified trimester: Secondary | ICD-10-CM

## 2013-12-16 DIAGNOSIS — O99891 Other specified diseases and conditions complicating pregnancy: Secondary | ICD-10-CM

## 2013-12-16 DIAGNOSIS — O9989 Other specified diseases and conditions complicating pregnancy, childbirth and the puerperium: Secondary | ICD-10-CM

## 2013-12-16 LAB — OB RESULTS CONSOLE GBS: STREP GROUP B AG: NEGATIVE

## 2013-12-16 LAB — POCT URINALYSIS DIP (DEVICE)
Bilirubin Urine: NEGATIVE
Glucose, UA: NEGATIVE mg/dL
Ketones, ur: NEGATIVE mg/dL
NITRITE: NEGATIVE
PH: 7 (ref 5.0–8.0)
PROTEIN: NEGATIVE mg/dL
Specific Gravity, Urine: 1.02 (ref 1.005–1.030)
Urobilinogen, UA: 1 mg/dL (ref 0.0–1.0)

## 2013-12-16 LAB — OB RESULTS CONSOLE GC/CHLAMYDIA
Chlamydia: NEGATIVE
GC PROBE AMP, GENITAL: NEGATIVE

## 2013-12-16 NOTE — Progress Notes (Signed)
Schedule US for growth. GBS and GC CT today.

## 2013-12-16 NOTE — Progress Notes (Signed)
U/S scheduled 12/23/13 at 245 pm.

## 2013-12-17 LAB — GC/CHLAMYDIA PROBE AMP
CT Probe RNA: NEGATIVE
GC Probe RNA: NEGATIVE

## 2013-12-18 LAB — CULTURE, BETA STREP (GROUP B ONLY)

## 2013-12-20 LAB — CULTURE, OB URINE: Colony Count: 50000

## 2013-12-23 ENCOUNTER — Ambulatory Visit (INDEPENDENT_AMBULATORY_CARE_PROVIDER_SITE_OTHER): Payer: Self-pay | Admitting: Obstetrics & Gynecology

## 2013-12-23 ENCOUNTER — Ambulatory Visit (HOSPITAL_COMMUNITY)
Admission: RE | Admit: 2013-12-23 | Discharge: 2013-12-23 | Disposition: A | Discharge status: Home / Self Care | Payer: Medicaid Other | Source: Ambulatory Visit | Attending: Obstetrics & Gynecology | Admitting: Obstetrics & Gynecology

## 2013-12-23 VITALS — BP 108/74 | HR 84 | Temp 97.6°F | Wt 108.1 lb

## 2013-12-23 DIAGNOSIS — O09299 Supervision of pregnancy with other poor reproductive or obstetric history, unspecified trimester: Secondary | ICD-10-CM

## 2013-12-23 DIAGNOSIS — R8290 Unspecified abnormal findings in urine: Secondary | ICD-10-CM

## 2013-12-23 DIAGNOSIS — O365990 Maternal care for other known or suspected poor fetal growth, unspecified trimester, not applicable or unspecified: Secondary | ICD-10-CM | POA: Insufficient documentation

## 2013-12-23 DIAGNOSIS — R82998 Other abnormal findings in urine: Secondary | ICD-10-CM

## 2013-12-23 LAB — POCT URINALYSIS DIP (DEVICE)
Bilirubin Urine: NEGATIVE
GLUCOSE, UA: NEGATIVE mg/dL
Ketones, ur: NEGATIVE mg/dL
NITRITE: NEGATIVE
Protein, ur: 30 mg/dL — AB
SPECIFIC GRAVITY, URINE: 1.02 (ref 1.005–1.030)
Urobilinogen, UA: 2 mg/dL — ABNORMAL HIGH (ref 0.0–1.0)
pH: 7 (ref 5.0–8.0)

## 2013-12-23 NOTE — Progress Notes (Signed)
C/o feeling wet everyday since last visit- states feels like pee, but she knows she just went to restroom.

## 2013-12-23 NOTE — Progress Notes (Signed)
UA with large LE, 1+ protein, trace Hgb, +urobili. Will send for culture. Still decreased fundal height, has growth ultrasound today, will follow up results and manage accordingly. No ROM noted on exam, cervix 1.5/60/-2/vertex, intact membranes palpated.  No other complaints or concerns.  Fetal movement and labor precautions reviewed.

## 2013-12-25 LAB — CULTURE, OB URINE: Colony Count: 6000

## 2013-12-31 ENCOUNTER — Encounter: Payer: Self-pay | Admitting: Family

## 2014-01-04 ENCOUNTER — Telehealth: Payer: Self-pay

## 2014-01-04 DIAGNOSIS — N390 Urinary tract infection, site not specified: Secondary | ICD-10-CM

## 2014-01-04 MED ORDER — AMPICILLIN 250 MG PO CAPS: 250.0000 mg | ORAL_CAPSULE | Freq: Three times a day (TID) | ORAL | Status: DC

## 2014-01-04 NOTE — Telephone Encounter (Signed)
Patient urine culture from 5/14 showed ecoli-- negative GBS and repeat urine culture on 5/21 showed 6,000 colonies (insignificant growth). Message sent to Dr. Debroah Loop regarding prescription.

## 2014-01-04 NOTE — Telephone Encounter (Signed)
Message copied by Louanna Raw on Tue Jan 04, 2014  4:36 PM ------      Message from: Adam Phenix      Created: Tue Jan 04, 2014  4:29 PM       Ampicillin 250 mg tid 7 days UTI, GBS positive ------

## 2014-01-06 NOTE — Telephone Encounter (Signed)
Spoke to Dr. Debroah Loop who confirmed patient does not have GBS and he no longer wants to treat patient with Ampicillin. No treatment necessary.

## 2014-01-13 ENCOUNTER — Encounter: Payer: Self-pay | Admitting: Family Medicine

## 2014-01-13 ENCOUNTER — Ambulatory Visit (INDEPENDENT_AMBULATORY_CARE_PROVIDER_SITE_OTHER): Payer: Self-pay | Admitting: Family Medicine

## 2014-01-13 VITALS — BP 102/61 | Temp 97.3°F | Wt 111.4 lb

## 2014-01-13 DIAGNOSIS — Z8619 Personal history of other infectious and parasitic diseases: Secondary | ICD-10-CM

## 2014-01-13 DIAGNOSIS — O0990 Supervision of high risk pregnancy, unspecified, unspecified trimester: Secondary | ICD-10-CM

## 2014-01-13 LAB — POCT URINALYSIS DIP (DEVICE)
Bilirubin Urine: NEGATIVE
Glucose, UA: NEGATIVE mg/dL
HGB URINE DIPSTICK: NEGATIVE
Ketones, ur: NEGATIVE mg/dL
NITRITE: NEGATIVE
PROTEIN: NEGATIVE mg/dL
SPECIFIC GRAVITY, URINE: 1.015 (ref 1.005–1.030)
UROBILINOGEN UA: 2 mg/dL — AB (ref 0.0–1.0)
pH: 7 (ref 5.0–8.0)

## 2014-01-13 MED ORDER — ACYCLOVIR 400 MG PO TABS: 400.0000 mg | ORAL_TABLET | Freq: Three times a day (TID) | ORAL | Status: DC

## 2014-01-13 NOTE — Progress Notes (Signed)
Reports intermittent pelvic pressure and irregular contractions.  

## 2014-01-13 NOTE — Patient Instructions (Signed)
Third Trimester of Pregnancy  The third trimester is from week 29 through week 42, months 7 through 9. The third trimester is a time when the fetus is growing rapidly. At the end of the ninth month, the fetus is about 20 inches in length and weighs 6 10 pounds.   BODY CHANGES  Your body goes through many changes during pregnancy. The changes vary from woman to woman.    Your weight will continue to increase. You can expect to gain 25 35 pounds (11 16 kg) by the end of the pregnancy.   You may begin to get stretch marks on your hips, abdomen, and breasts.   You may urinate more often because the fetus is moving lower into your pelvis and pressing on your bladder.   You may develop or continue to have heartburn as a result of your pregnancy.   You may develop constipation because certain hormones are causing the muscles that push waste through your intestines to slow down.   You may develop hemorrhoids or swollen, bulging veins (varicose veins).   You may have pelvic pain because of the weight gain and pregnancy hormones relaxing your joints between the bones in your pelvis. Back aches may result from over exertion of the muscles supporting your posture.   Your breasts will continue to grow and be tender. A yellow discharge may leak from your breasts called colostrum.   Your belly button may stick out.   You may feel short of breath because of your expanding uterus.   You may notice the fetus "dropping," or moving lower in your abdomen.   You may have a bloody mucus discharge. This usually occurs a few days to a week before labor begins.   Your cervix becomes thin and soft (effaced) near your due date.  WHAT TO EXPECT AT YOUR PRENATAL EXAMS   You will have prenatal exams every 2 weeks until week 36. Then, you will have weekly prenatal exams. During a routine prenatal visit:   You will be weighed to make sure you and the fetus are growing normally.   Your blood pressure is taken.   Your abdomen will be  measured to track your baby's growth.   The fetal heartbeat will be listened to.   Any test results from the previous visit will be discussed.   You may have a cervical check near your due date to see if you have effaced.  At around 36 weeks, your caregiver will check your cervix. At the same time, your caregiver will also perform a test on the secretions of the vaginal tissue. This test is to determine if a type of bacteria, Group B streptococcus, is present. Your caregiver will explain this further.  Your caregiver may ask you:   What your birth plan is.   How you are feeling.   If you are feeling the baby move.   If you have had any abnormal symptoms, such as leaking fluid, bleeding, severe headaches, or abdominal cramping.   If you have any questions.  Other tests or screenings that may be performed during your third trimester include:   Blood tests that check for low iron levels (anemia).   Fetal testing to check the health, activity level, and growth of the fetus. Testing is done if you have certain medical conditions or if there are problems during the pregnancy.  FALSE LABOR  You may feel small, irregular contractions that eventually go away. These are called Braxton Hicks contractions, or   false labor. Contractions may last for hours, days, or even weeks before true labor sets in. If contractions come at regular intervals, intensify, or become painful, it is best to be seen by your caregiver.   SIGNS OF LABOR    Menstrual-like cramps.   Contractions that are 5 minutes apart or less.   Contractions that start on the top of the uterus and spread down to the lower abdomen and back.   A sense of increased pelvic pressure or back pain.   A watery or bloody mucus discharge that comes from the vagina.  If you have any of these signs before the 37th week of pregnancy, call your caregiver right away. You need to go to the hospital to get checked immediately.  HOME CARE INSTRUCTIONS    Avoid all  smoking, herbs, alcohol, and unprescribed drugs. These chemicals affect the formation and growth of the baby.   Follow your caregiver's instructions regarding medicine use. There are medicines that are either safe or unsafe to take during pregnancy.   Exercise only as directed by your caregiver. Experiencing uterine cramps is a good sign to stop exercising.   Continue to eat regular, healthy meals.   Wear a good support bra for breast tenderness.   Do not use hot tubs, steam rooms, or saunas.   Wear your seat belt at all times when driving.   Avoid raw meat, uncooked cheese, cat litter boxes, and soil used by cats. These carry germs that can cause birth defects in the baby.   Take your prenatal vitamins.   Try taking a stool softener (if your caregiver approves) if you develop constipation. Eat more high-fiber foods, such as fresh vegetables or fruit and whole grains. Drink plenty of fluids to keep your urine clear or pale yellow.   Take warm sitz baths to soothe any pain or discomfort caused by hemorrhoids. Use hemorrhoid cream if your caregiver approves.   If you develop varicose veins, wear support hose. Elevate your feet for 15 minutes, 3 4 times a day. Limit salt in your diet.   Avoid heavy lifting, wear low heal shoes, and practice good posture.   Rest a lot with your legs elevated if you have leg cramps or low back pain.   Visit your dentist if you have not gone during your pregnancy. Use a soft toothbrush to brush your teeth and be gentle when you floss.   A sexual relationship may be continued unless your caregiver directs you otherwise.   Do not travel far distances unless it is absolutely necessary and only with the approval of your caregiver.   Take prenatal classes to understand, practice, and ask questions about the labor and delivery.   Make a trial run to the hospital.   Pack your hospital bag.   Prepare the baby's nursery.   Continue to go to all your prenatal visits as directed  by your caregiver.  SEEK MEDICAL CARE IF:   You are unsure if you are in labor or if your water has broken.   You have dizziness.   You have mild pelvic cramps, pelvic pressure, or nagging pain in your abdominal area.   You have persistent nausea, vomiting, or diarrhea.   You have a bad smelling vaginal discharge.   You have pain with urination.  SEEK IMMEDIATE MEDICAL CARE IF:    You have a fever.   You are leaking fluid from your vagina.   You have spotting or bleeding from your vagina.     You have severe abdominal cramping or pain.   You have rapid weight loss or gain.   You have shortness of breath with chest pain.   You notice sudden or extreme swelling of your face, hands, ankles, feet, or legs.   You have not felt your baby move in over an hour.   You have severe headaches that do not go away with medicine.   You have vision changes.  Document Released: 07/16/2001 Document Revised: 03/24/2013 Document Reviewed: 09/22/2012  [employer] Patient Information 2014 [employer].

## 2014-01-13 NOTE — Progress Notes (Addendum)
+  FM, no lof, no vb, +Ctx  Will start HSV prophylaxis today at treatment dose for 5d then taper to maintenance dose SPEC exam: no active lesions. Reviewed typical location for outbreak for patient and none seen. Internal exam neg as well.  Membranes stripped today Size <dates, [place], 5/21 shows 42%tile. Will not repeat at this time.  Pt is ready for delivery  Veronica Shea is a 23 y.o. G2P1001 at [redacted]w[redacted]d here for ROB visit. Negative (05/14 0000)  Discussed with Patient:  - Plans to breast/bottle feed.  All questions answered. - Continue prenatal vitamins. - Reviewed fetal kick counts Pt to perform daily at a time when the baby is active, lie laterally with both hands on belly in quiet room and count all movements (hiccups, shoulder rolls, obvious kicks, etc); pt is to report to clinic MAU for less than 10 movements felt in a 2 hour time period-pt told as soon as she counts 10 movements the count is complete.  - Routine precautions discussed (depression, infection s/s).   Patient provided with all pertinent phone numbers for emergencies. - RTC for any VB, regular, painful cramps/ctxs occurring at a rate of >2/10 min, fever (100.5 or higher), n/v/d, any pain that is unresolving or worsening, LOF, decreased fetal movement, CP, SOB, edema -RTC in one week for next visit.  Problems: Patient Active Problem List   Diagnosis Date Noted  . History of herpes genitalis 01/13/2014  . Supervision of high-risk pregnancy 08/19/2013  . IUGR (intrauterine growth restriction) in prior pregnancy, pregnant 08/19/2013    To Do: 1. acyclovir  [ ]  Vaccines: recd [ x] BCM: mirena [x ] Readiness: baby has a place to sleep, car seat, other baby necessities.  Edu: [x ] PTL precautions; [ ]  BF class; [ ]  childbirth class; [ ]   BF counseling;

## 2014-01-14 ENCOUNTER — Encounter (HOSPITAL_COMMUNITY): Payer: Self-pay | Admitting: *Deleted

## 2014-01-14 ENCOUNTER — Inpatient Hospital Stay (HOSPITAL_COMMUNITY)
Admission: AD | Admit: 2014-01-14 | Discharge: 2014-01-16 | DRG: 774 | Disposition: A | Discharge status: Home / Self Care | Payer: Medicaid Other | Source: Ambulatory Visit | Attending: Obstetrics & Gynecology | Admitting: Obstetrics & Gynecology

## 2014-01-14 DIAGNOSIS — O0990 Supervision of high risk pregnancy, unspecified, unspecified trimester: Secondary | ICD-10-CM

## 2014-01-14 DIAGNOSIS — O09299 Supervision of pregnancy with other poor reproductive or obstetric history, unspecified trimester: Secondary | ICD-10-CM

## 2014-01-14 DIAGNOSIS — Z8249 Family history of ischemic heart disease and other diseases of the circulatory system: Secondary | ICD-10-CM

## 2014-01-14 DIAGNOSIS — A6000 Herpesviral infection of urogenital system, unspecified: Secondary | ICD-10-CM | POA: Diagnosis present

## 2014-01-14 DIAGNOSIS — IMO0001 Reserved for inherently not codable concepts without codable children: Secondary | ICD-10-CM

## 2014-01-14 DIAGNOSIS — O98519 Other viral diseases complicating pregnancy, unspecified trimester: Principal | ICD-10-CM | POA: Diagnosis present

## 2014-01-14 DIAGNOSIS — Z8619 Personal history of other infectious and parasitic diseases: Secondary | ICD-10-CM

## 2014-01-14 DIAGNOSIS — Z833 Family history of diabetes mellitus: Secondary | ICD-10-CM

## 2014-01-14 NOTE — MAU Note (Signed)
Contractions for last 3 hours. Some bloody, mucousy d/c

## 2014-01-14 NOTE — MAU Note (Signed)
PT SAYS SHE STARTED HURTING BAD AT 7PM-    GETS [facility] - [facility]STYAIRS-  SEEN LAST-  Thursday-   VE 3-4 CM.    HAS HX HSV -  5 MTHS  AGO-   TAKING VALTREX-   DENIES  OUTBREAK  NOW.   DENIES MRSA.   GBS-  NEG

## 2014-01-15 ENCOUNTER — Encounter (HOSPITAL_COMMUNITY): Payer: Self-pay

## 2014-01-15 DIAGNOSIS — Z8249 Family history of ischemic heart disease and other diseases of the circulatory system: Secondary | ICD-10-CM

## 2014-01-15 DIAGNOSIS — Z833 Family history of diabetes mellitus: Secondary | ICD-10-CM

## 2014-01-15 DIAGNOSIS — IMO0001 Reserved for inherently not codable concepts without codable children: Secondary | ICD-10-CM

## 2014-01-15 DIAGNOSIS — O479 False labor, unspecified: Secondary | ICD-10-CM | POA: Diagnosis present

## 2014-01-15 DIAGNOSIS — O98519 Other viral diseases complicating pregnancy, unspecified trimester: Secondary | ICD-10-CM | POA: Diagnosis present

## 2014-01-15 DIAGNOSIS — A6000 Herpesviral infection of urogenital system, unspecified: Secondary | ICD-10-CM

## 2014-01-15 LAB — ABO/RH: ABO/RH(D): A POS

## 2014-01-15 LAB — CBC
HCT: 33.8 % — ABNORMAL LOW (ref 36.0–46.0)
Hemoglobin: 11.5 g/dL — ABNORMAL LOW (ref 12.0–15.0)
MCH: 29 pg (ref 26.0–34.0)
MCHC: 34 g/dL (ref 30.0–36.0)
MCV: 85.1 fL (ref 78.0–100.0)
Platelets: 244 10*3/uL (ref 150–400)
RBC: 3.97 MIL/uL (ref 3.87–5.11)
RDW: 13.5 % (ref 11.5–15.5)
WBC: 13.7 10*3/uL — ABNORMAL HIGH (ref 4.0–10.5)

## 2014-01-15 LAB — RPR

## 2014-01-15 LAB — TYPE AND SCREEN
ABO/RH(D): A POS
Antibody Screen: NEGATIVE

## 2014-01-15 MED ORDER — IBUPROFEN 600 MG PO TABS
600.0000 mg | ORAL_TABLET | Freq: Four times a day (QID) | ORAL | Status: DC
  Administered 2014-01-15 – 2014-01-16 (×5): 600 mg via ORAL
  Filled 2014-01-15 (×5): qty 1

## 2014-01-15 MED ORDER — CITRIC ACID-SODIUM CITRATE 334-500 MG/5ML PO SOLN: 30.0000 mL | ORAL | Status: DC | PRN

## 2014-01-15 MED ORDER — DIBUCAINE 1 % RE OINT: 1.0000 "application " | TOPICAL_OINTMENT | RECTAL | Status: DC | PRN

## 2014-01-15 MED ORDER — ONDANSETRON HCL 4 MG/2ML IJ SOLN: 4.0000 mg | INTRAMUSCULAR | Status: DC | PRN

## 2014-01-15 MED ORDER — FLEET ENEMA 7-19 GM/118ML RE ENEM: 1.0000 | ENEMA | RECTAL | Status: DC | PRN

## 2014-01-15 MED ORDER — ACETAMINOPHEN 325 MG PO TABS: 650.0000 mg | ORAL_TABLET | ORAL | Status: DC | PRN

## 2014-01-15 MED ORDER — WITCH HAZEL-GLYCERIN EX PADS: 1.0000 "application " | MEDICATED_PAD | CUTANEOUS | Status: DC | PRN

## 2014-01-15 MED ORDER — IBUPROFEN 600 MG PO TABS
600.0000 mg | ORAL_TABLET | Freq: Four times a day (QID) | ORAL | Status: DC | PRN
  Administered 2014-01-15: 600 mg via ORAL
  Filled 2014-01-15: qty 1

## 2014-01-15 MED ORDER — PRENATAL MULTIVITAMIN CH
1.0000 | ORAL_TABLET | Freq: Every day | ORAL | Status: DC
  Administered 2014-01-15 – 2014-01-16 (×2): 1 via ORAL
  Filled 2014-01-15 (×2): qty 1

## 2014-01-15 MED ORDER — TETANUS-DIPHTH-ACELL PERTUSSIS 5-2.5-18.5 LF-MCG/0.5 IM SUSP: 0.5000 mL | Freq: Once | INTRAMUSCULAR | Status: DC

## 2014-01-15 MED ORDER — OXYCODONE-ACETAMINOPHEN 5-325 MG PO TABS: 1.0000 | ORAL_TABLET | ORAL | Status: DC | PRN

## 2014-01-15 MED ORDER — FENTANYL CITRATE 0.05 MG/ML IJ SOLN
100.0000 ug | INTRAMUSCULAR | Status: DC | PRN
  Administered 2014-01-15 (×2): 100 ug via INTRAVENOUS
  Filled 2014-01-15 (×2): qty 2

## 2014-01-15 MED ORDER — ONDANSETRON HCL 4 MG PO TABS: 4.0000 mg | ORAL_TABLET | ORAL | Status: DC | PRN

## 2014-01-15 MED ORDER — DIPHENHYDRAMINE HCL 25 MG PO CAPS: 25.0000 mg | ORAL_CAPSULE | Freq: Four times a day (QID) | ORAL | Status: DC | PRN

## 2014-01-15 MED ORDER — LACTATED RINGERS IV SOLN
INTRAVENOUS | Status: DC
  Administered 2014-01-15 (×2): via INTRAVENOUS

## 2014-01-15 MED ORDER — OXYTOCIN 40 UNITS IN LACTATED RINGERS INFUSION - SIMPLE MED
62.5000 mL/h | INTRAVENOUS | Status: DC
  Filled 2014-01-15: qty 1000

## 2014-01-15 MED ORDER — OXYTOCIN BOLUS FROM INFUSION
500.0000 mL | INTRAVENOUS | Status: DC
  Administered 2014-01-15: 500 mL via INTRAVENOUS

## 2014-01-15 MED ORDER — ONDANSETRON HCL 4 MG/2ML IJ SOLN: 4.0000 mg | Freq: Four times a day (QID) | INTRAMUSCULAR | Status: DC | PRN

## 2014-01-15 MED ORDER — SIMETHICONE 80 MG PO CHEW: 80.0000 mg | CHEWABLE_TABLET | ORAL | Status: DC | PRN

## 2014-01-15 MED ORDER — LACTATED RINGERS IV SOLN: 500.0000 mL | INTRAVENOUS | Status: DC | PRN

## 2014-01-15 MED ORDER — SENNOSIDES-DOCUSATE SODIUM 8.6-50 MG PO TABS
2.0000 | ORAL_TABLET | ORAL | Status: DC
Start: 2014-01-16 — End: 2014-01-16
  Administered 2014-01-16: 2 via ORAL
  Filled 2014-01-15 (×2): qty 1

## 2014-01-15 MED ORDER — ZOLPIDEM TARTRATE 5 MG PO TABS: 5.0000 mg | ORAL_TABLET | Freq: Every evening | ORAL | Status: DC | PRN

## 2014-01-15 MED ORDER — LIDOCAINE HCL (PF) 1 % IJ SOLN
30.0000 mL | INTRAMUSCULAR | Status: DC | PRN
  Filled 2014-01-15: qty 30

## 2014-01-15 MED ORDER — BENZOCAINE-MENTHOL 20-0.5 % EX AERO: 1.0000 "application " | INHALATION_SPRAY | CUTANEOUS | Status: DC | PRN

## 2014-01-15 MED ORDER — LANOLIN HYDROUS EX OINT: TOPICAL_OINTMENT | CUTANEOUS | Status: DC | PRN

## 2014-01-15 NOTE — H&P (Signed)
Attestation of Attending Supervision of Advanced Practitioner (PA/CNM/NP): Evaluation and management procedures were performed by the Advanced Practitioner under my supervision and collaboration.  I have reviewed the Advanced Practitioner's note and chart, and I agree with the management and plan.  Jilliann Subramanian, MD, FACOG Attending Obstetrician & Gynecologist Faculty Practice, [facility]  

## 2014-01-15 NOTE — Lactation Note (Signed)
This note was copied from the chart of Veronica Shea. Lactation Consultation Note initial visit at 16 hours of age.  Baby is asleep in crib.  Mom reports a few feedings, 1 void, 1 stool and baby has been sleepy today.  [facility] resources given and discussed.  Encouraged to feed with early cues on demand.  Early newborn behavior discussed.  Hand expression demonstrated with colostrum visible.  Mom to call for assist as needed.    Patient Name: Veronica Shea Today's Date: 01/15/2014     Maternal Data Has patient been taught Hand Expression?: Yes Does the patient have breastfeeding experience prior to this delivery?: Yes  Feeding Feeding Type: Breast Fed Length of feed: 30 min  LATCH Score/Interventions          Comfort (Breast/Nipple): Filling, red/small blisters or bruises, mild/mod discomfort  Problem noted: Mild/Moderate discomfort (mother instructed to call nurse for assistance with latch)  Intervention(s): Breastfeeding basics reviewed     Lactation Tools Discussed/Used     Consult Status Consult Status: Follow-up Date: 01/16/14 Follow-up type: In-patient    Jannifer RodneyShoptaw, Aaden Buckman Lynn 01/15/2014, 10:52 PM

## 2014-01-15 NOTE — H&P (Signed)
Veronica Shea is a 23 y.o. female G2P1001 @ 40.0wks presenting for eval of ctx. Denies leaking; having some bloody show. [facility] and has been remarkable for 1) hx SGA infant (5+5 @ 40wks) 2) hx HSV. She denies recent outbreaks.  History OB History   Grav Para Term Preterm Abortions TAB SAB Ect Mult Living   2 1 1  0 0 0 0 0 0 1     Past Medical History  Diagnosis Date  . Herpes    Past Surgical History  Procedure Laterality Date  . No past surgeries     Family History: family history includes Diabetes in her sister; Hypertension in her father. Social History:  reports that she has never smoked. She has never used smokeless tobacco. She reports that she does not drink alcohol or use illicit drugs.   Prenatal Transfer Tool  Maternal Diabetes: No Genetic Screening: Abnormal:  Results: Elevated AFP- borderline Maternal Ultrasounds/Referrals: Normal Fetal Ultrasounds or other Referrals:  Referred to Materal Fetal Medicine - nl anatomy per MFM Maternal Substance Abuse:  No Significant Maternal Medications:  None Significant Maternal Lab Results:  Lab values include: Group B Strep negative Other Comments:  None  ROS  Dilation: 6 Effacement (%): 80 Station: -1 Exam by:: DCALLAWAY, RN Blood pressure 112/77, pulse 81, temperature 98.4 F (36.9 C), temperature source Oral, resp. rate 20, height 4' 10.5" (1.486 m), weight 50.259 kg (110 lb 12.8 oz), last menstrual period 03/17/2013. Exam Physical Exam  Constitutional: She is oriented to person, place, and time. She appears well-developed.  HENT:  Head: Normocephalic.  Cardiovascular: Normal rate.   Respiratory: Effort normal.  GI:  EFM 125-135, +accels, no decels Irreg ctx  Musculoskeletal: Normal range of motion.  Neurological: She is alert and oriented to person, place, and time.  Skin: Skin is warm and dry.  Psychiatric: She has a normal mood and affect. Her behavior is normal. Thought content  normal.    Prenatal labs: ABO, Rh: A/Positive/-- (12/04 0000) Antibody: Negative (12/04 0000) Rubella: Immune (12/04 0000) RPR: NON REAC (03/12 0933)  HBsAg: Negative (12/04 0000)  HIV: NON REACTIVE (03/12 0933)  GBS: Negative (05/14 0000)   Assessment/Plan: IUP @ 40.0wks Early active labor GBS neg  [facility] pt made cx change from 4/70 to 6/80 in MAU Expectant management Anticipate SVD    Cam HaiSHAW, KIMBERLY CNM 01/15/2014, 3:05 AM

## 2014-01-16 MED ORDER — IBUPROFEN 600 MG PO TABS: 600.0000 mg | ORAL_TABLET | Freq: Four times a day (QID) | ORAL | Status: DC

## 2014-01-16 NOTE — Discharge Instructions (Signed)

## 2014-01-16 NOTE — Lactation Note (Signed)
This note was copied from the chart of Veronica Shea. Lactation Consultation Note    Follow up consult with this mom and baby, now 27 hours post partum. Mom was sitting on side of bed, breast feeding baby, show was sitting in her lap, with a shallow latch. i taught mom the importance of good back support, and how to better position the baby across her breasts for a deeper latch. Mom reports this latch more comfortable ,. I also reviedwed breast care and engorgement care. Mom denies any other questions at this time.  Patient Name: Veronica Shea [id number]'s Date: 01/16/2014 Reason for consult: Follow-up assessment   Maternal Data    Feeding Feeding Type: Breast Fed  LATCH Score/Interventions Latch: Grasps breast easily, tongue down, lips flanged, rhythmical sucking. (Simultaneous filing. User may not have seen previous data.)  Audible Swallowing: A few with stimulation (Simultaneous filing. User may not have seen previous data.)  Type of Nipple: Everted at rest and after stimulation (Simultaneous filing. User may not have seen previous data.)  Comfort (Breast/Nipple): Soft / non-tender (Simultaneous filing. User may not have seen previous data.)     Hold (Positioning): Assistance needed to correctly position infant at breast and maintain latch. (Simultaneous filing. User may not have seen previous data.) Intervention(s): Breastfeeding basics reviewed;Support Pillows;Position options;Skin to skin  LATCH Score: 8 (Simultaneous filing. User may not have seen previous data.)  Lactation Tools Discussed/Used     Consult Status Consult Status: Complete Follow-up type: Call as needed    Veronica Shea, Veronica Shea 01/16/2014, 10:35 AM

## 2014-01-16 NOTE — Discharge Summary (Signed)
Obstetric Discharge Summary Reason for Admission: onset of labor Prenatal Procedures: none Intrapartum Procedures: spontaneous vaginal delivery Postpartum Procedures: none Complications-Operative and Postpartum: None  Hospital Course: Pt admittedo n 6/13 and rapidly progressed to deliver vaginally as below. Pt had no complications PP and met all milestones to discharge early. Pt desires 24hr discharge. Pain well controlled, bleeding appropriate, tolerating PO, no barriers to discharge. Will send home.   Delivery Note  Pt pushed well and at 6:38 AM a viable female was delivered via Vaginal, Spontaneous Delivery (Presentation: ; Occiput Anterior). APGAR: 9, 9; weight: pending. Infant dried and lifted to pt's abd. Cord clamped and cut by FOB. Hospital lab blood sample collected from cord.  Placenta status: Intact, Spontaneous. Cord: 3 vessels  Anesthesia: None  Episiotomy: None  Lacerations: None  Est. Blood Loss (mL): 300  Mom to postpartum. Baby to Couplet care / Skin to Skin.  Serita Grammes CNM  01/15/2014, 7:17 AM     H/H: Lab Results  Component Value Date/Time   HGB 11.5* 01/15/2014  2:40 AM   HGB 13.8 07/08/2013   HCT 33.8* 01/15/2014  2:40 AM   HCT 41 07/08/2013    Filed Vitals:   01/16/14 0645  BP: 94/55  Pulse: 63  Temp: 97.7 F (36.5 C)  Resp: 18    Physical Exam: VSS NAD Abd: Appropriately tender, ND, Fundus _0 -2 Incision: NA No c/c/e, Neg homan's sign, neg cords Lochia Appropriate  Discharge Diagnoses: Term Pregnancy-delivered  Discharge Information: Date: 02/14/2011 Activity: pelvic rest Diet: routine  Medications: PNV and Ibuprofen Breast feeding:  Yes Condition: stable Instructions: refer to handout Discharge to: home      Medication List    STOP taking these medications       acyclovir 400 MG tablet  Commonly known as:  ZOVIRAX      TAKE these medications       ibuprofen 600 MG tablet  Commonly known as:  ADVIL,MOTRIN  Take 1 tablet  (600 mg total) by mouth every 6 (six) hours.     prenatal multivitamin Tabs tablet  Take 1 tablet by mouth daily at 12 noon.       Follow-up Information   Follow up with [facility]. (For Postpartum Visit and IUD placement)    Specialty:  Obstetrics and Gynecology   Contact information:   [place] [address] [phone]      Fredrik Rigger 01/16/2014,7:54 AM

## 2014-01-20 ENCOUNTER — Ambulatory Visit: Payer: Medicaid Other | Admitting: Family

## 2014-01-20 LAB — POCT URINALYSIS DIP (DEVICE)
Bilirubin Urine: NEGATIVE
Glucose, UA: NEGATIVE mg/dL
Ketones, ur: NEGATIVE mg/dL
Nitrite: NEGATIVE
PROTEIN: NEGATIVE mg/dL
SPECIFIC GRAVITY, URINE: 1.02 (ref 1.005–1.030)
Urobilinogen, UA: 0.2 mg/dL (ref 0.0–1.0)
pH: 6.5 (ref 5.0–8.0)

## 2014-02-17 ENCOUNTER — Ambulatory Visit (INDEPENDENT_AMBULATORY_CARE_PROVIDER_SITE_OTHER): Payer: Self-pay | Admitting: Advanced Practice Midwife

## 2014-02-17 ENCOUNTER — Encounter: Payer: Self-pay | Admitting: Advanced Practice Midwife

## 2014-02-17 MED ORDER — NORETHINDRONE 0.35 MG PO TABS: 1.0000 | ORAL_TABLET | Freq: Every day | ORAL | Status: DC

## 2014-02-17 NOTE — Progress Notes (Signed)
Patient ID: Veronica Shea, female   DOB: 02/05/1991, 23 y.o.   MRN: [id number]  Subjective:  HPI  CC: postpartum visit  # Postpartum visit:  No complaints, still breastfeeding  Small amount of spotting, no large clots  Denies depressive feelings, SI, HI  Desires mirena for contraception, paying out of pocket so will apply for scholarship or go to health department. [facility] and patient would like micronor pill until she can get the mirena  Needs Pap smear (last in 2013) ROS: no CP, no SOB, some calf swelling that resolved after 1 week, no abdominal pain, diarrhea, constipation, +vaginal spotting, +vaginal discharge but no itchiness or discomfort  Review of Systems   See HPI for ROS. Objective:  BP 98/61  Pulse 69  Temp(Src) 98.6 F (37 C) (Oral)  Ht 4\' 10"  (1.473 m)  Wt 97 lb 6.4 oz (44.18 kg)  BMI 20.36 kg/m2  Breastfeeding? Yes  General: NAD Cardiac: RRR, normal heart sounds, no murmurs. 2+ radial and PT pulses bilaterally Respiratory: CTAB, normal effort Abdomen: soft, nontender, nondistended, no hepatic or splenomegaly. Bowel sounds present GU: external exam only, small amount white mucosal discharge, no bleeding, no other lesions noted.    Assessment & Plan:  23 y.o. female here for postpartum visit. Doing well. Desires mirena for contraception but paying out of pocket.  1. Contraception: Information given for health dept for IUD. micronor rx filled for interim BC 2. Information given for free pap smear clinic 3. Normal postpartum care, no need for additional follow up

## 2014-02-23 ENCOUNTER — Encounter: Payer: Self-pay | Admitting: Advanced Practice Midwife

## 2014-02-23 NOTE — Progress Notes (Signed)
Seen also by me  Agree with Dr Laban EmperorWight's note

## 2014-02-23 NOTE — Patient Instructions (Signed)

## 2014-06-06 ENCOUNTER — Encounter: Payer: Self-pay | Admitting: Advanced Practice Midwife

## 2014-12-05 IMAGING — US US OB COMP +14 WK
1 series · 12 of 28 positions shown · non-contrast
Comparison: none

[Series 1: us ob comp +14 wk · 66 acquisitions, 12 frames shown]
[im 3/66]
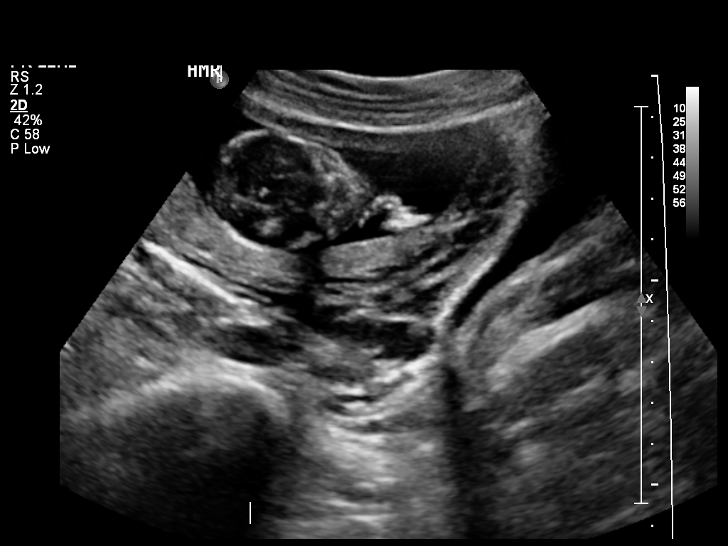
[im 8/66]
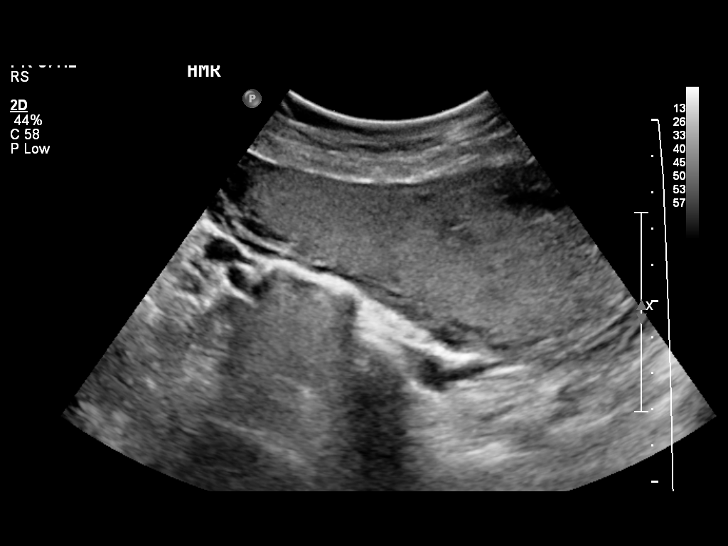
[im 13/66]
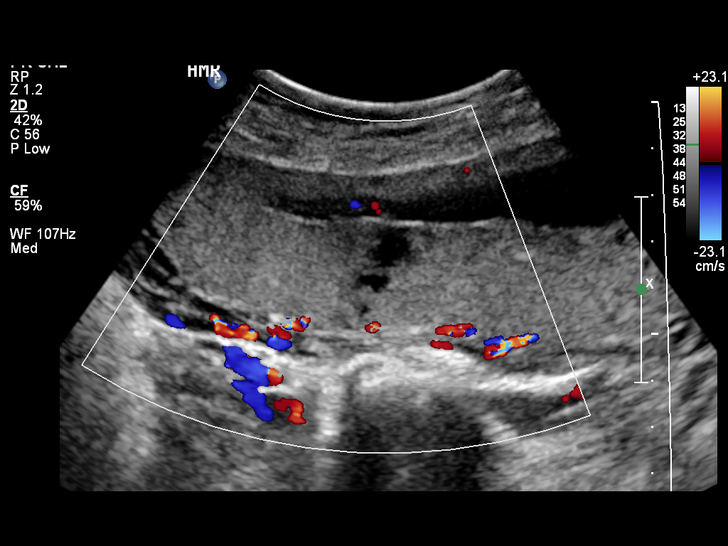
[im 20/66]
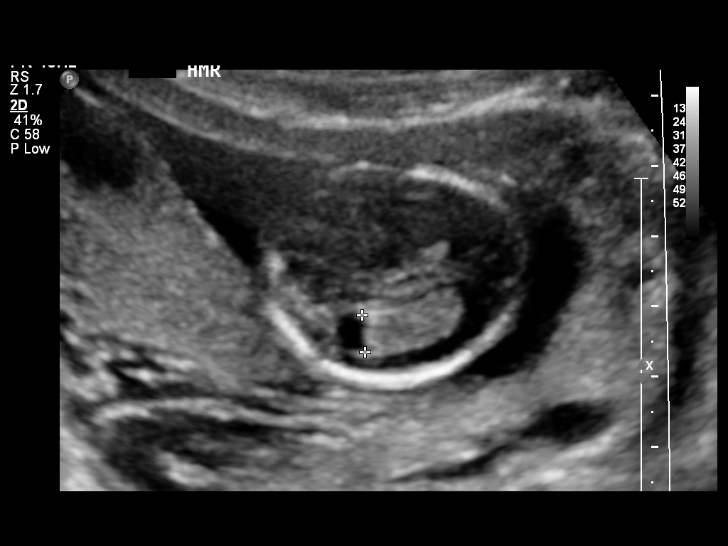
[im 25/66]
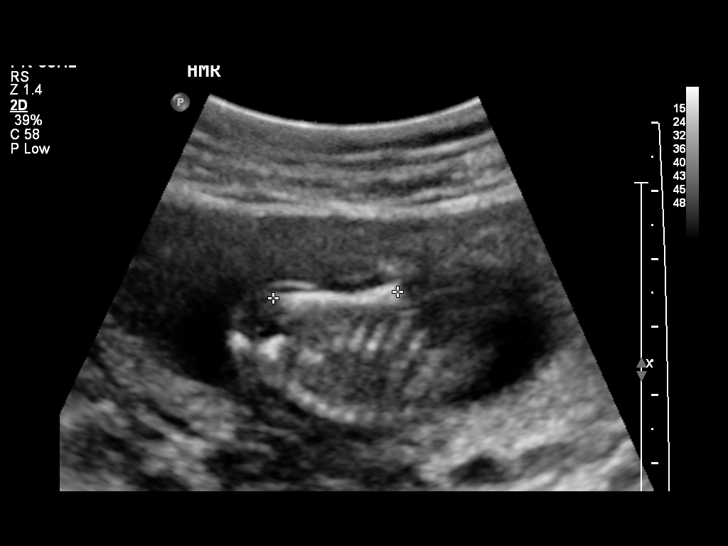
[im 29/66]
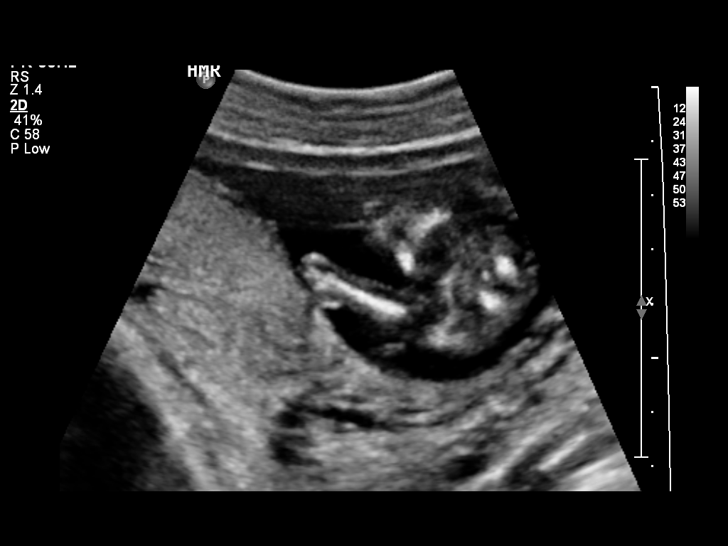
[im 37/66]
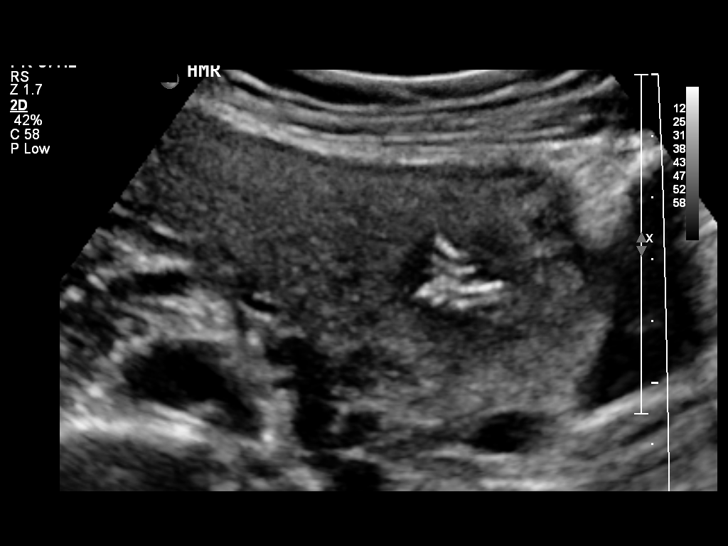
[im 41/66]
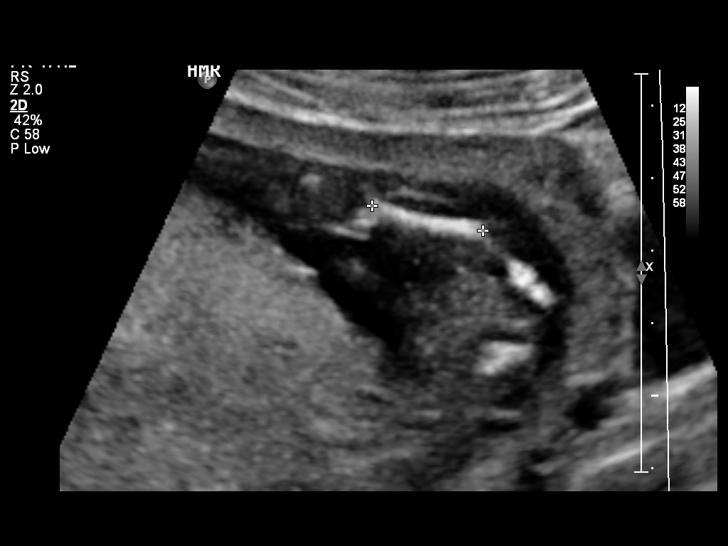
[im 46/66]
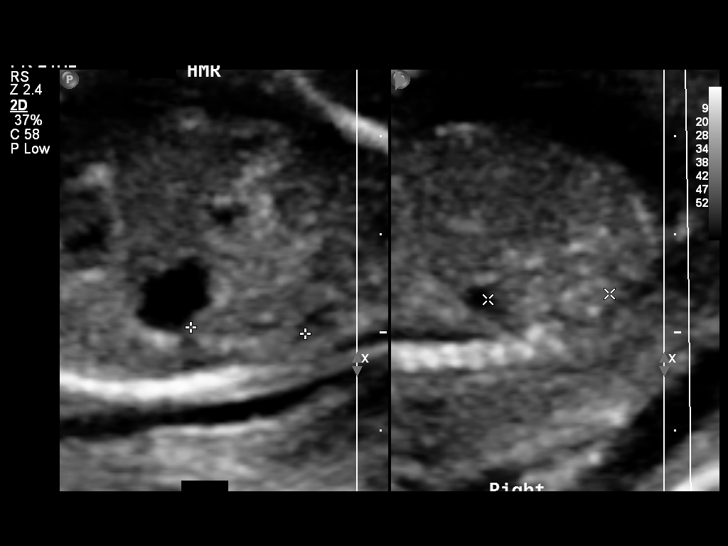
[im 53/66]
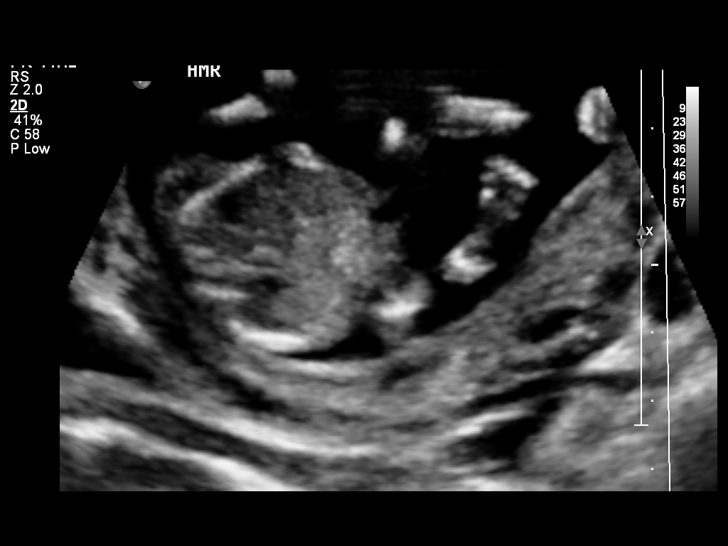
[im 58/66]
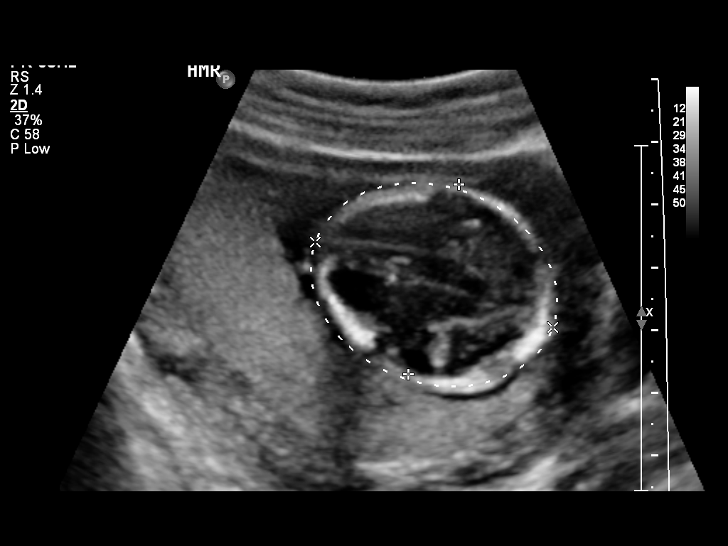
[im 63/66]
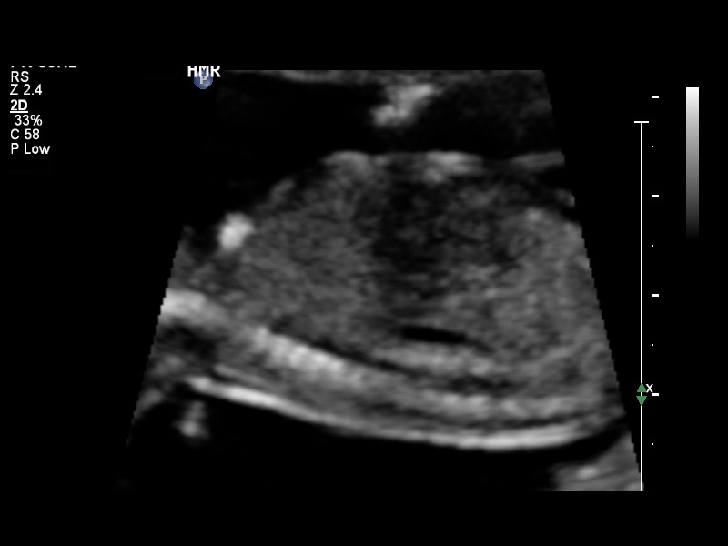

[12 of 28 positions shown; findings below may reference images not displayed]

OBSTETRICS REPORT
                      (Signed Final 07/26/2013 [DATE])

Service(s) Provided

 US OB COMP + 14 WK                                    76805.1
Indications

 Basic anatomic survey
 Size less than dates (Small for gestational [AGE]
 FGR)
Fetal Evaluation

 Num Of Fetuses:    1
 Fetal Heart Rate:  156                          bpm
 Cardiac Activity:  Observed
 Presentation:      Transverse, head to
                    maternal right
 Placenta:          Posterior, above cervical
                    os
 P. Cord            Visualized, central
 Insertion:

 Amniotic Fluid
 AFI FV:      Subjectively within normal limits
                                             Larg Pckt:    3.44  cm
 RLQ:   3.44    cm
Biometry

 BPD:     30.6  mm     G. Age:  15w 4d                CI:        73.84   70 - 86
                                                      FL/HC:      14.3   15.3 -

 HC:     113.1  mm     G. Age:  15w 4d       44  %    HC/AC:      1.21   1.05 -

 AC:      93.2  mm     G. Age:  15w 4d       59  %    FL/BPD:
 FL:      16.2  mm     G. Age:  14w 5d       25  %    FL/AC:      17.4   20 - 24
 HUM:       18  mm     G. Age:  15w 1d       51  %

 Est. FW:     117  gm      0 lb 4 oz     72  %
Gestational Age

 LMP:           18w 5d        Date:  03/17/13                 EDD:   12/22/13
 U/S Today:     15w 2d                                        EDD:   01/15/14
 Best:          15w 2d     Det. By:  U/S (07/26/13)           EDD:   01/15/14
Anatomy

 Cranium:          Appears normal         Aortic Arch:      Not well visualized
 Fetal Cavum:      Not well visualized    Ductal Arch:      Not well visualized
 Ventricles:       Appears normal         Diaphragm:        Not well visualized
 Choroid Plexus:   Appears normal         Stomach:          Appears normal, left
                                                            sided
 Cerebellum:       Not well visualized    Abdomen:          Appears normal
 Posterior Fossa:  Not well visualized    Abdominal Wall:   Appears nml (cord
                                                            insert, abd wall)
 Nuchal Fold:      Not applicable (< 16   Cord Vessels:     Appears normal (3
                   wks GA)                                  vessel cord)
 Face:             Appears normal         Kidneys:          Not well visualized
                   (orbits and profile)
 Lips:             Not well visualized    Bladder:          Not well visualized
 Palate:           Not well visualized    Spine:            Not well visualized
 Heart:            Not well visualized    Lower             Not well visualized
                                          Extremities:
 RVOT:             Not well visualized    Upper             Not well visualized
                                          Extremities:
 LVOT:             Not well visualized

 Other:  Fetus appears to be a female. Technically difficult due to early GA.
Targeted Anatomy

 Fetal Central Nervous System
 Lat. Ventricles:
Cervix Uterus Adnexa

 Cervical Length:    3.09     cm

 Cervix:       Normal appearance by transabdominal scan.
 Uterus:       No abnormality visualized.
 Cul De Sac:   No free fluid seen.

 Left Ovary:    Not visualized. No adnexal mass visualized.
 Right Ovary:   Not visualized. No adnexal mass visualized.
 Adnexa:     No adnexal mass visualized.
Impression

 Active SIUP at 03w1d
 Dating by today's ultrasound biometry with EDC 01/15/14
 EFW at 72nd percentile
 Amniotic fluid volume is gestational age appropriate
 No dysmorphic features noting that today's evaluation is
 limited by early gestational age (warrant reassessment for
 comprehensive fetal survey at 18-19 weeks gestation)
Recommendations

 Follow up attempt to complete fetal survey in 4 weeks.

## 2015-01-06 IMAGING — US US OB FOLLOW-UP
1 series · 12 of 28 positions shown · non-contrast
Comparison: none

[Series 1: us ob follow up · 12 of 97 slices shown]
[im 4/97]
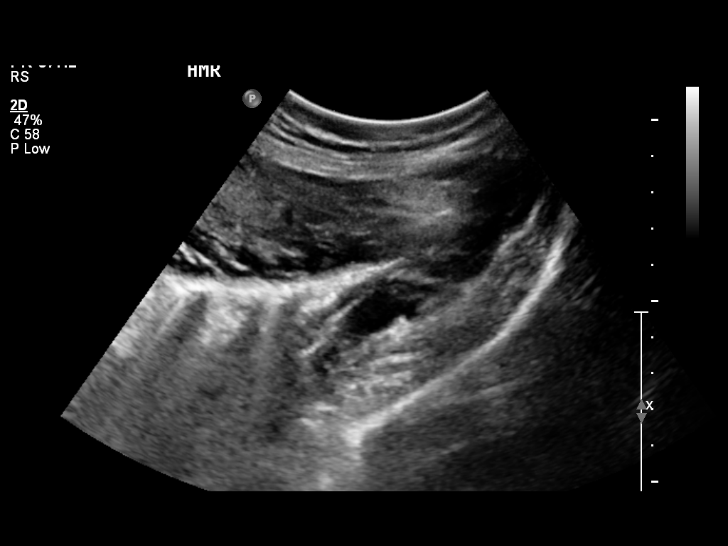
[im 11/97]
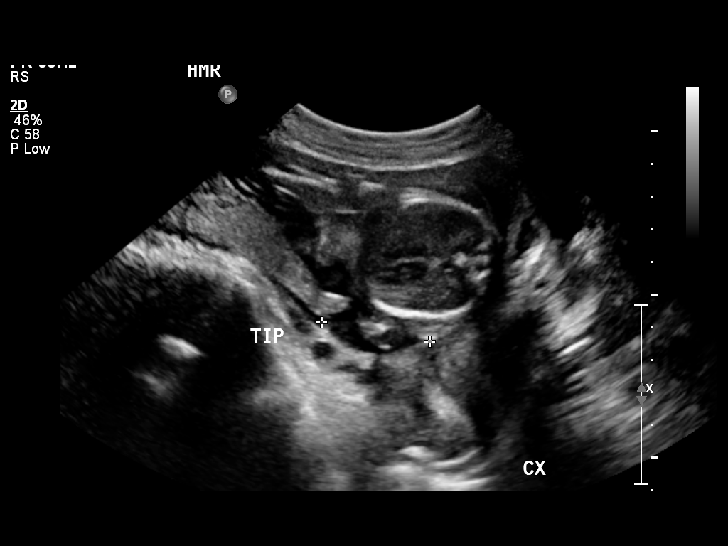
[im 18/97]
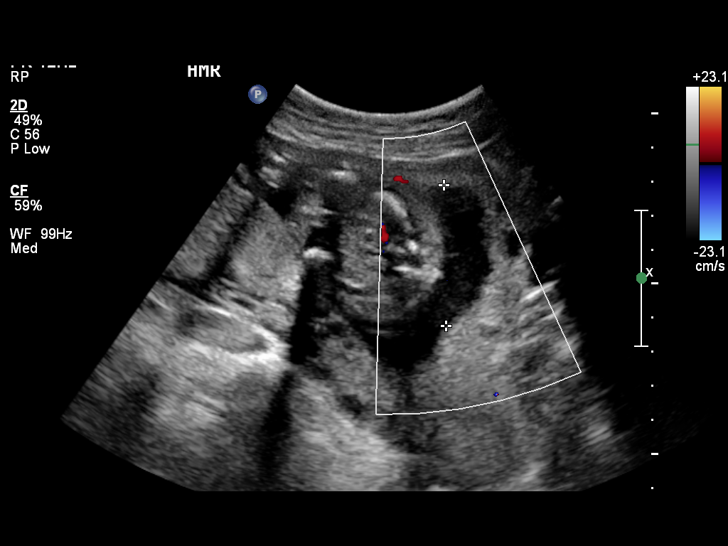
[im 29/97]
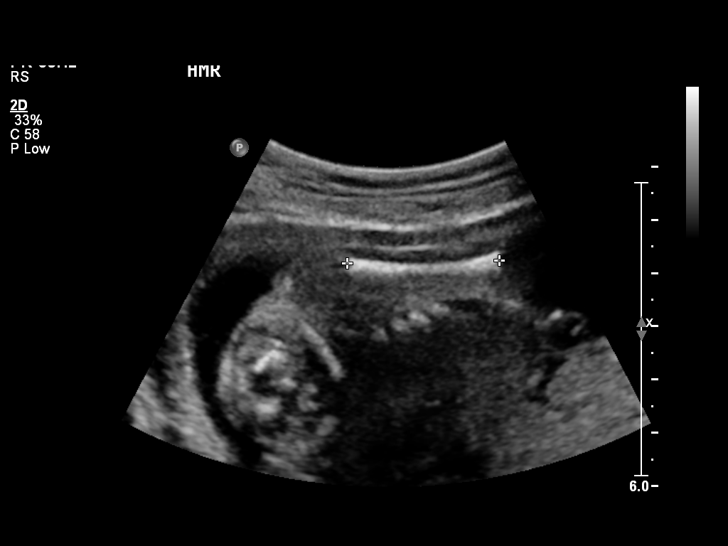
[im 36/97]
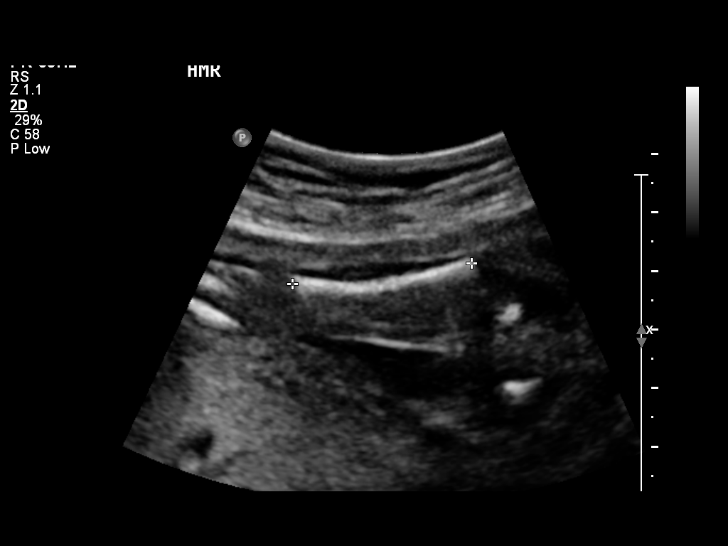
[im 43/97]
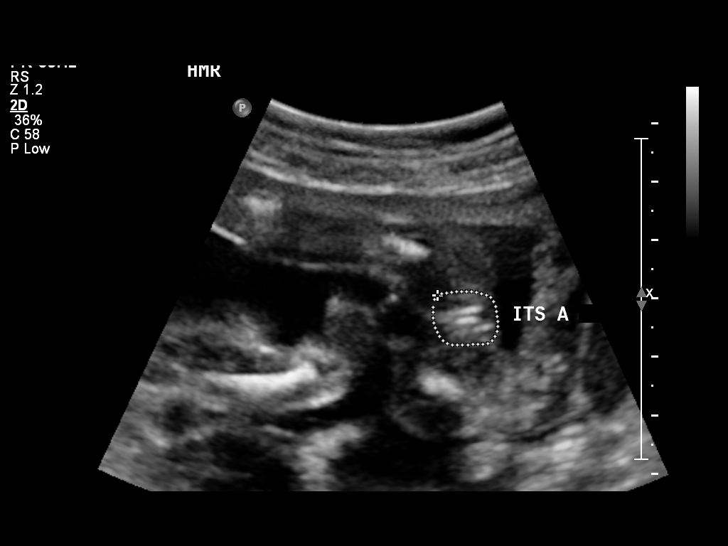
[im 54/97]
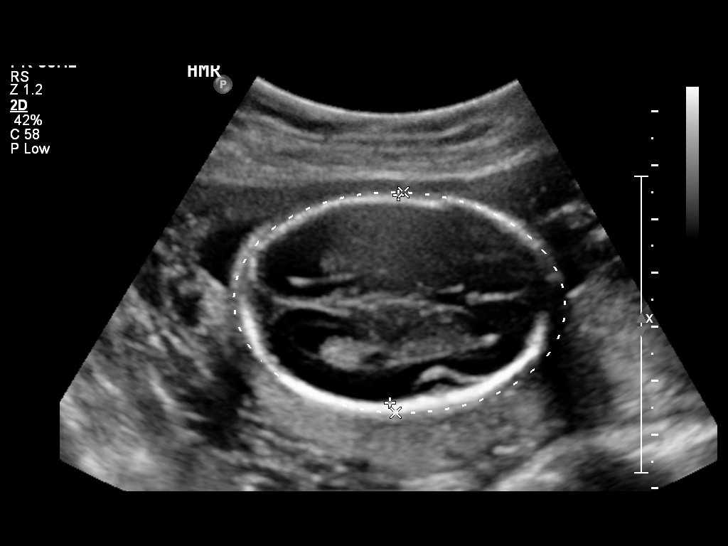
[im 61/97]
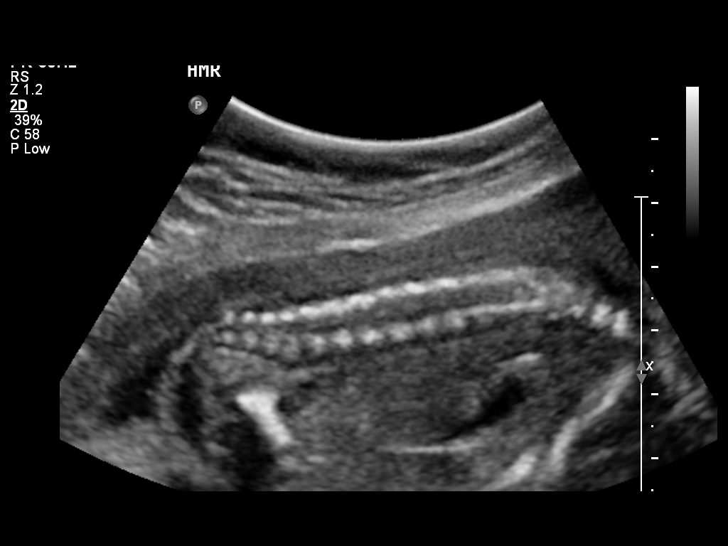
[im 68/97]
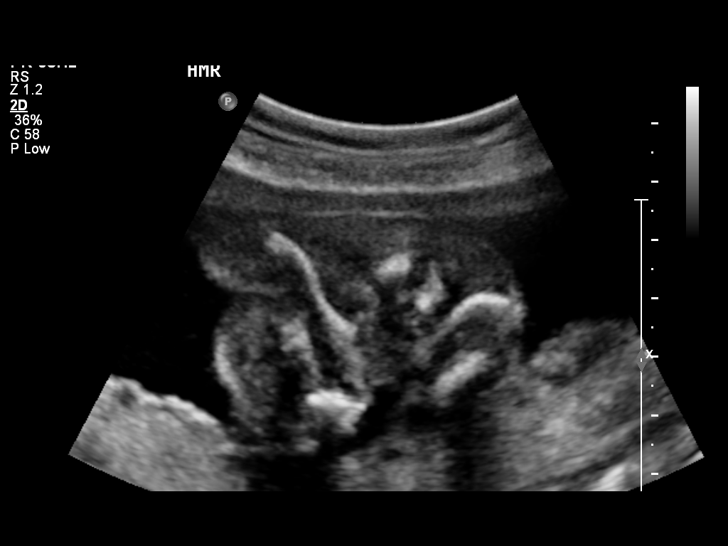
[im 79/97]
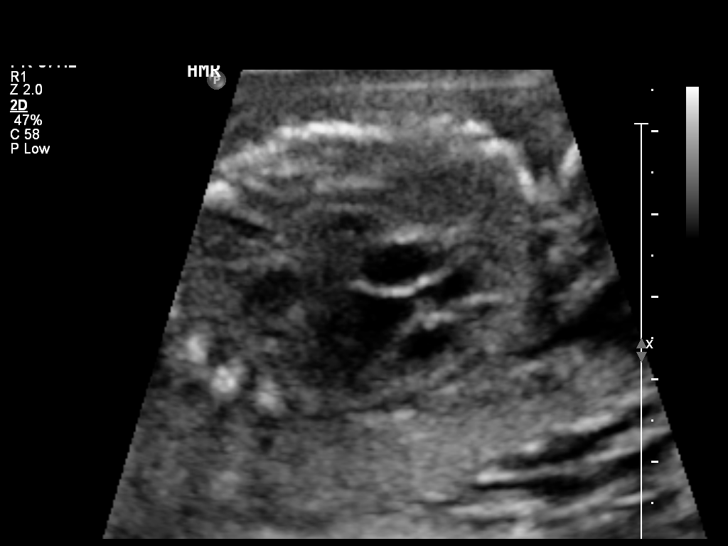
[im 86/97]
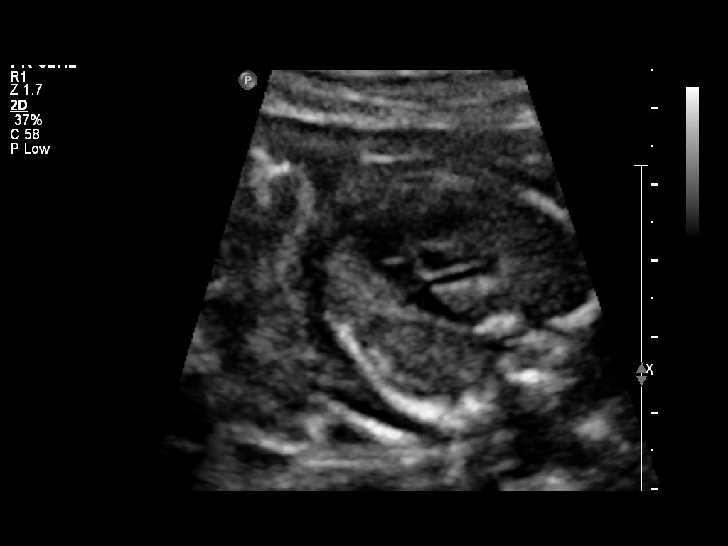
[im 93/97]
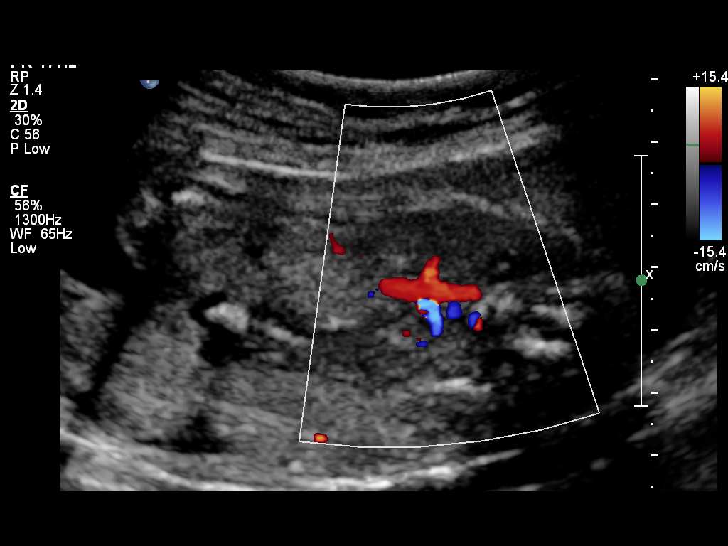

[12 of 28 positions shown; findings below may reference images not displayed]

OBSTETRICS REPORT
                      (Signed Final 08/27/2013 [DATE])

Service(s) Provided

 US OB FOLLOW UP                                       76816.1
Indications

 Follow-up incomplete fetal anatomic evaluation
Fetal Evaluation

 Num Of Fetuses:    1
 Fetal Heart Rate:  147                          bpm
 Cardiac Activity:  Observed
 Presentation:      Variable
 Placenta:          Posterior, above cervical
                    os
 P. Cord            Previously Visualized
 Insertion:

 Amniotic Fluid
 AFI FV:      Subjectively within normal limits
                                             Larg Pckt:     4.1  cm
Biometry

 BPD:     38.4  mm     G. Age:  17w 5d                CI:        58.47   70 - 86
                                                      FL/HC:      18.6   16.8 -

 HC:     163.4  mm     G. Age:  19w 1d       13  %    HC/AC:      1.08   1.09 -

 AC:     151.3  mm     G. Age:  20w 2d       61  %    FL/BPD:
 FL:      30.4  mm     G. Age:  19w 3d       26  %    FL/AC:      20.1   20 - 24
 HUM:     28.5  mm     G. Age:  19w 1d       36  %
 CER:     18.5  mm     G. Age:  18w 2d      < 5  %
 NFT:     2.77  mm

 Est. FW:     311  gm    0 lb 11 oz      47  %
Gestational Age

 LMP:           23w 2d        Date:  03/17/13                 EDD:   12/22/13
 U/S Today:     19w 1d                                        EDD:   01/20/14
 Best:          19w 6d     Det. By:  U/S (07/26/13)           EDD:   01/15/14
Anatomy
 Cranium:          Appears normal         Aortic Arch:      Appears normal
 Fetal Cavum:      Appears normal         Ductal Arch:      Appears normal
 Ventricles:       Appears normal         Diaphragm:        Appears normal
 Choroid Plexus:   Appears normal         Stomach:          Appears normal, left
                                                            sided
 Cerebellum:       Appears normal         Abdomen:          Appears normal
 Posterior Fossa:  Appears normal         Abdominal Wall:   Appears nml (cord
                                                            insert, abd wall)
 Nuchal Fold:      Appears normal         Cord Vessels:     Appears normal (3
                                                            vessel cord)
 Face:             Orbits appear          Kidneys:          Appear normal
                   normal
 Lips:             Appears normal         Bladder:          Appears normal
 Heart:            Appears normal         Spine:            Appears normal
                   (4CH, axis, and
                   situs)
 RVOT:             Appears normal         Lower             Appears normal
                                          Extremities:
 LVOT:             Appears normal         Upper             Appears normal
                                          Extremities:

 Other:  Fetus appears to be a female. Technically difficult due to fetal
         position. Heels and 5th digit visualized.
Targeted Anatomy

 Fetal Central Nervous System
 Lat. Ventricles:  5.3                    Cisterna Magna:
Cervix Uterus Adnexa

 Cervical Length:    3.25     cm

 Cervix:       Normal appearance by transabdominal scan.
 Uterus:       No abnormality visualized.
 Cul De Sac:   No free fluid seen.

 Left Ovary:    Not visualized.
 Right Ovary:   Not visualized.
 Adnexa:     No abnormality visualized. No adnexal mass visualized.
Impression

 Single IUP at 19 [DATE] weeks
 Normal interval anatomy - the anatomic survey is now
 complete
 No markers associated with aneuploidy noted
 Posterior placenta without previa
 Normal amniotic fluid volume
Recommendations

 Follow-up ultrasounds as clinically indicated.

## 2015-03-23 IMAGING — US US OB FOLLOW-UP
1 series · 12 of 28 positions shown · non-contrast
Comparison: none

[Series 1: us ob follow up · 12 of 40 slices shown]
[im 2/40]
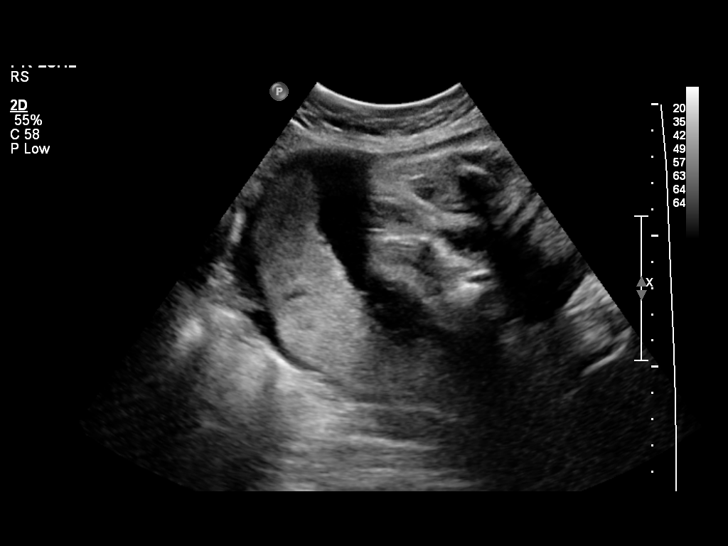
[im 5/40]
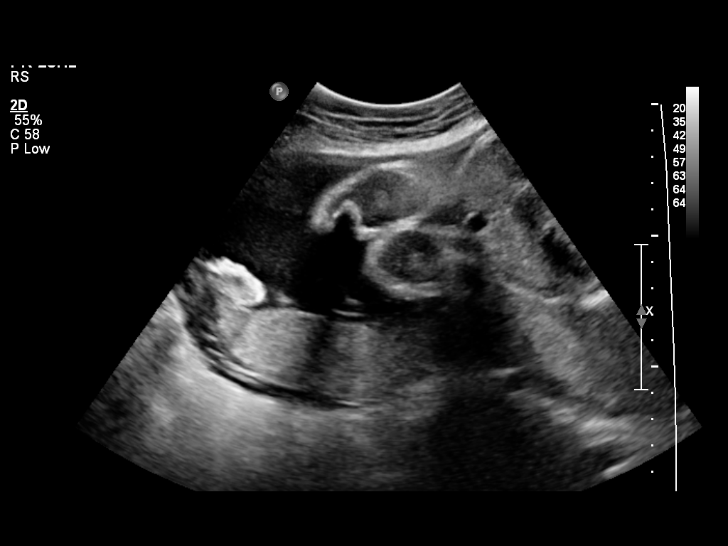
[im 8/40]
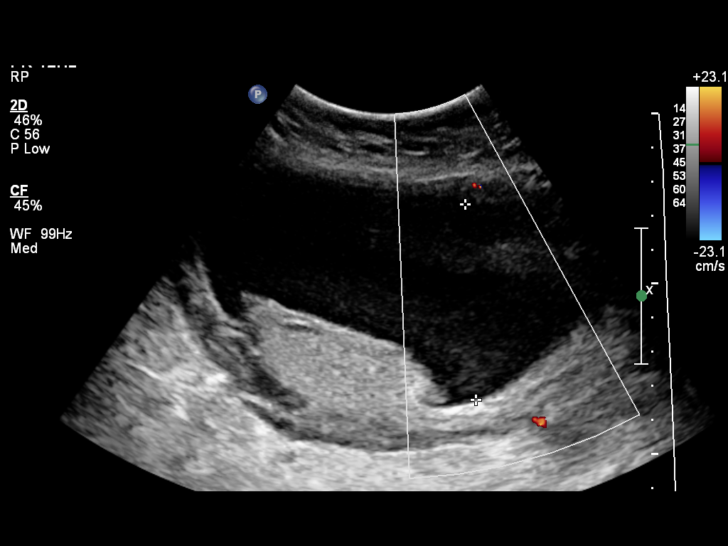
[im 12/40]
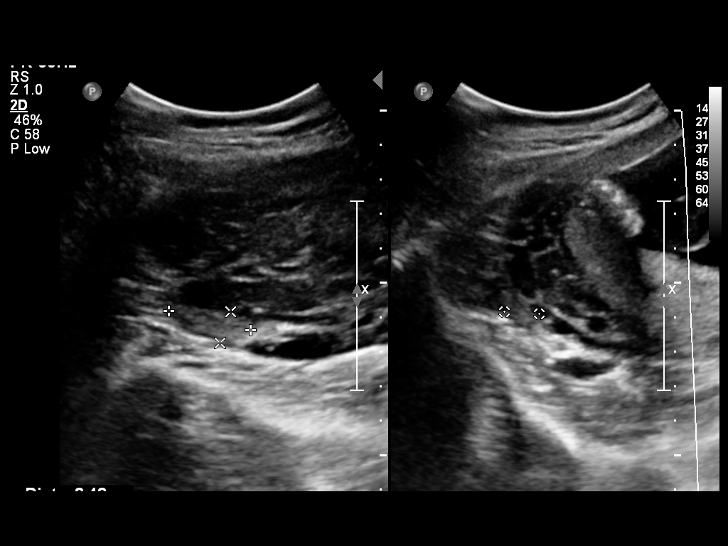
[im 15/40]
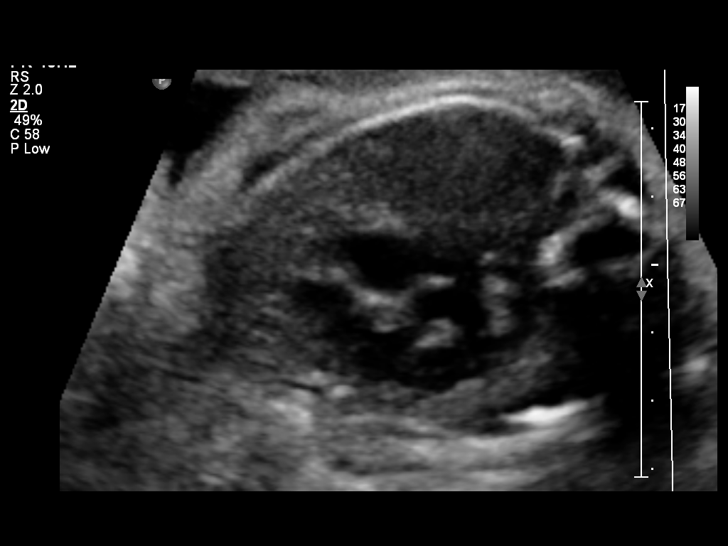
[im 18/40]
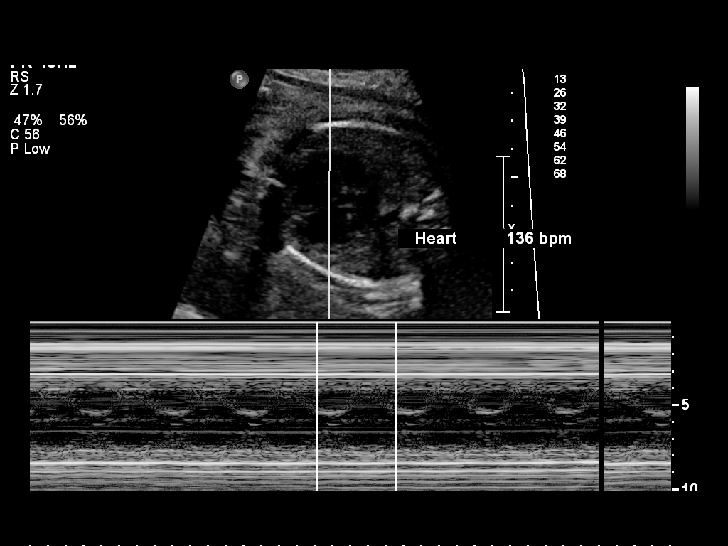
[im 22/40]
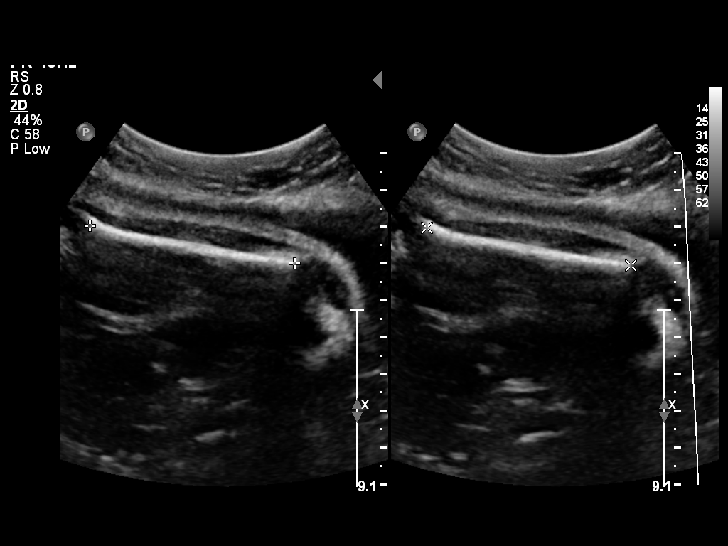
[im 25/40]
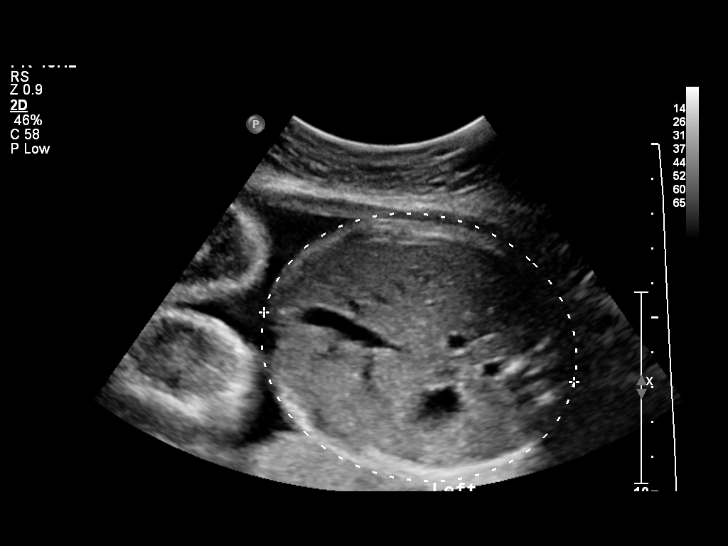
[im 28/40]
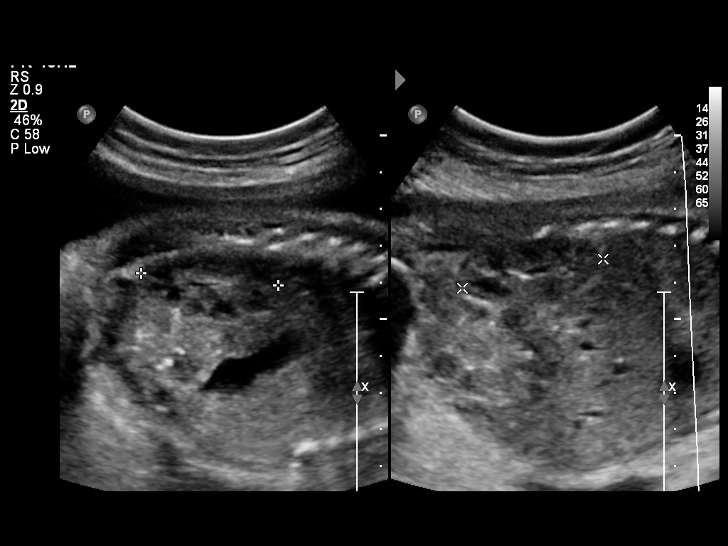
[im 32/40]
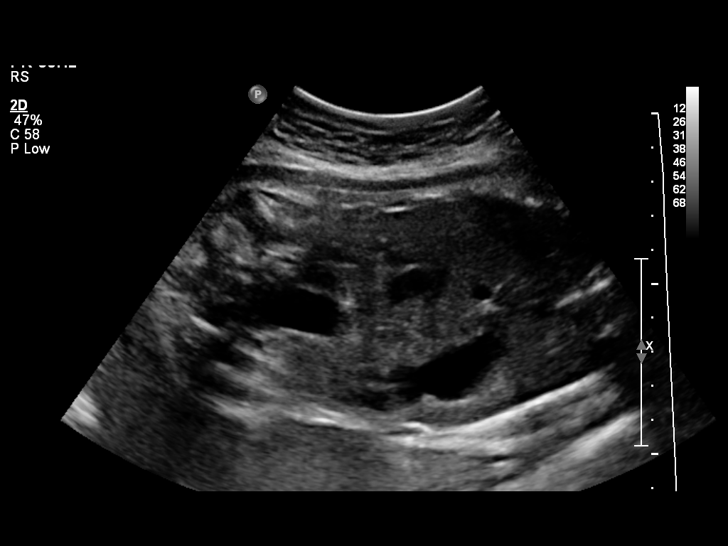
[im 35/40]
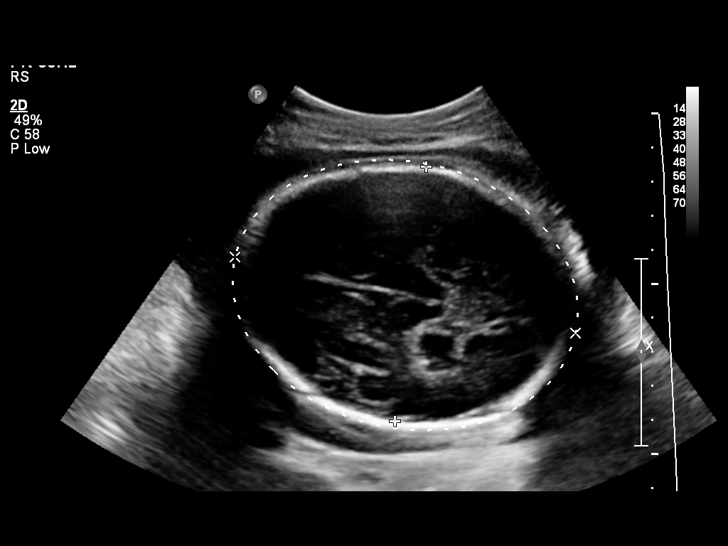
[im 38/40]
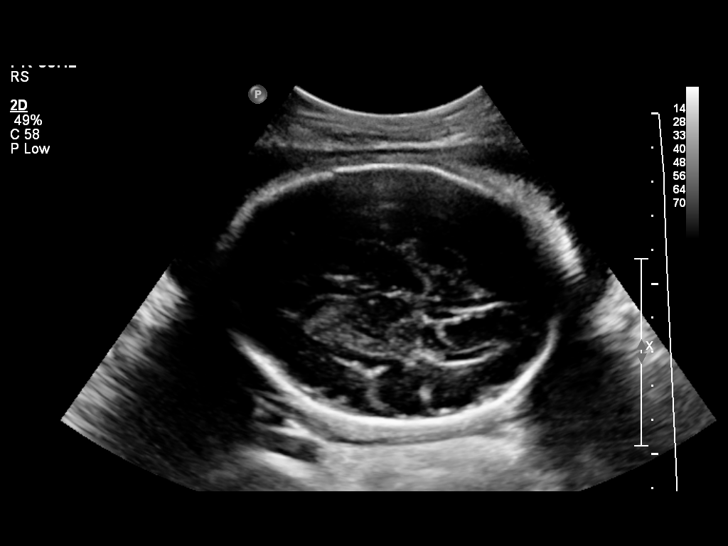

[12 of 28 positions shown; findings below may reference images not displayed]

OBSTETRICS REPORT
                      (Signed Final 11/11/2013 [DATE])

                                                         CNM
Service(s) Provided

 US OB FOLLOW UP                                       76816.1
Indications

 Poor obstetrical history (hx SGA infant)
Fetal Evaluation

 Num Of Fetuses:    1
 Fetal Heart Rate:  136                          bpm
 Cardiac Activity:  Observed
 Presentation:      Cephalic
 Placenta:          Posterior, above cervical
                    os
 P. Cord            Previously Visualized
 Insertion:

 Amniotic Fluid
 AFI FV:      Subjectively within normal limits
 AFI Sum:     16.11   cm       58  %Tile     Larg Pckt:    5.72  cm
 RUQ:   3.8     cm   RLQ:    2.45   cm    LUQ:   5.72    cm   LLQ:    4.14   cm
Biometry

 BPD:     74.9  mm     G. Age:  30w 0d                CI:        70.33   70 - 86
                                                      FL/HC:      19.7   19.3 -

 HC:     284.8  mm     G. Age:  31w 2d       29  %    HC/AC:      1.08   0.96 -

 AC:     263.2  mm     G. Age:  30w 3d       39  %    FL/BPD:     75.0   71 - 87
 FL:      56.2  mm     G. Age:  29w 4d       10  %    FL/AC:      21.4   20 - 24
 HUM:     49.1  mm     G. Age:  29w 0d       13  %

 Est. FW:    7657  gm      3 lb 6 oz     44  %
Gestational Age

 LMP:           34w 1d        Date:  03/17/13                 EDD:   12/22/13
 U/S Today:     30w 2d                                        EDD:   01/18/14
 Best:          30w 5d     Det. By:  U/S (07/26/13)           EDD:   01/15/14
Anatomy

 Cranium:          Appears normal         Aortic Arch:      Previously seen
 Fetal Cavum:      Appears normal         Ductal Arch:      Previously seen
 Ventricles:       Appears normal         Diaphragm:        Appears normal
 Choroid Plexus:   Previously seen        Stomach:          Appears normal, left
                                                            sided
 Cerebellum:       Previously seen        Abdomen:          Appears normal
 Posterior Fossa:  Previously seen        Abdominal Wall:   Previously seen
 Nuchal Fold:      Previously seen        Cord Vessels:     Previously seen
 Face:             Orbits previously      Kidneys:          Appear normal
                   seen
 Lips:             Appears normal         Bladder:          Appears normal
 Heart:            Appears normal         Spine:            Previously seen
                   (4CH, axis, and
                   situs)
 RVOT:             Appears normal         Lower             Previously seen
                                          Extremities:
 LVOT:             Appears normal         Upper             Previously seen
                                          Extremities:

 Other:  Fetus appears to be a female. Technically difficult due to fetal
         position. Heels and 5th digit previously  visualized.
Cervix Uterus Adnexa

 Cervix:       Not visualized (advanced GA >40wks)
 Uterus:       No abnormality visualized.
 Cul De Sac:   No free fluid seen.
 Left Ovary:    Not visualized.
 Right Ovary:   Within normal limits.

 Adnexa:     No abnormality visualized.
Impression

 Single IUP at 30w 5d
 Interval growth is appropriate (44th %tile)
 Normal interval anatomy
 Posterior placenta without previa
 Normal amniotic fluid volume
Recommendations

 Follow-up ultrasounds as clinically indicated.

## 2017-12-24 DIAGNOSIS — R0782 Intercostal pain: Secondary | ICD-10-CM | POA: Diagnosis not present

## 2017-12-24 DIAGNOSIS — Z13220 Encounter for screening for lipoid disorders: Secondary | ICD-10-CM | POA: Diagnosis not present

## 2017-12-24 DIAGNOSIS — Z131 Encounter for screening for diabetes mellitus: Secondary | ICD-10-CM | POA: Diagnosis not present

## 2017-12-24 DIAGNOSIS — F419 Anxiety disorder, unspecified: Secondary | ICD-10-CM | POA: Diagnosis not present

## 2018-02-13 DIAGNOSIS — F411 Generalized anxiety disorder: Secondary | ICD-10-CM | POA: Diagnosis not present

## 2018-02-13 DIAGNOSIS — Z124 Encounter for screening for malignant neoplasm of cervix: Secondary | ICD-10-CM | POA: Diagnosis not present

## 2018-03-20 DIAGNOSIS — H52223 Regular astigmatism, bilateral: Secondary | ICD-10-CM | POA: Diagnosis not present

## 2018-03-20 DIAGNOSIS — H5211 Myopia, right eye: Secondary | ICD-10-CM | POA: Diagnosis not present

## 2018-03-20 DIAGNOSIS — H5202 Hypermetropia, left eye: Secondary | ICD-10-CM | POA: Diagnosis not present

## 2018-04-02 DIAGNOSIS — H5213 Myopia, bilateral: Secondary | ICD-10-CM | POA: Diagnosis not present

## 2018-04-11 ENCOUNTER — Emergency Department (HOSPITAL_COMMUNITY)
Admission: EM | Admit: 2018-04-11 | Discharge: 2018-04-11 | Disposition: A | Discharge status: Home / Self Care | Payer: Self-pay | Attending: Emergency Medicine | Admitting: Emergency Medicine

## 2018-04-11 ENCOUNTER — Encounter (HOSPITAL_COMMUNITY): Payer: Self-pay | Admitting: Emergency Medicine

## 2018-04-11 ENCOUNTER — Other Ambulatory Visit: Payer: Self-pay

## 2018-04-11 DIAGNOSIS — R519 Headache, unspecified: Secondary | ICD-10-CM

## 2018-04-11 DIAGNOSIS — Z79899 Other long term (current) drug therapy: Secondary | ICD-10-CM | POA: Insufficient documentation

## 2018-04-11 DIAGNOSIS — R51 Headache: Secondary | ICD-10-CM | POA: Insufficient documentation

## 2018-04-11 LAB — COMPREHENSIVE METABOLIC PANEL
ALT: 15 U/L (ref 0–44)
AST: 18 U/L (ref 15–41)
Albumin: 4.1 g/dL (ref 3.5–5.0)
Alkaline Phosphatase: 85 U/L (ref 38–126)
Anion gap: 10 (ref 5–15)
BUN: 13 mg/dL (ref 6–20)
CHLORIDE: 108 mmol/L (ref 98–111)
CO2: 20 mmol/L — ABNORMAL LOW (ref 22–32)
Calcium: 9.3 mg/dL (ref 8.9–10.3)
Creatinine, Ser: 0.59 mg/dL (ref 0.44–1.00)
GFR calc non Af Amer: >60 mL/min (ref >60)
Glucose, Bld: 91 mg/dL (ref 70–99)
POTASSIUM: 3.5 mmol/L (ref 3.5–5.1)
Sodium: 138 mmol/L (ref 135–145)
TOTAL PROTEIN: 7.1 g/dL (ref 6.5–8.1)
Total Bilirubin: 0.8 mg/dL (ref 0.3–1.2)

## 2018-04-11 LAB — CBC
HCT: 41.5 % (ref 36.0–46.0)
HEMOGLOBIN: 14.2 g/dL (ref 12.0–15.0)
MCH: 30 pg (ref 26.0–34.0)
MCHC: 34.2 g/dL (ref 30.0–36.0)
MCV: 87.6 fL (ref 78.0–100.0)
Platelets: 317 10*3/uL (ref 150–400)
RBC: 4.74 MIL/uL (ref 3.87–5.11)
RDW: 12.1 % (ref 11.5–15.5)
WBC: 8 10*3/uL (ref 4.0–10.5)

## 2018-04-11 LAB — URINALYSIS, ROUTINE W REFLEX MICROSCOPIC
Bilirubin Urine: NEGATIVE
GLUCOSE, UA: NEGATIVE mg/dL
Hgb urine dipstick: NEGATIVE
KETONES UR: NEGATIVE mg/dL
Leukocytes, UA: NEGATIVE
NITRITE: NEGATIVE
Protein, ur: NEGATIVE mg/dL
SPECIFIC GRAVITY, URINE: 1.02 (ref 1.005–1.030)
pH: 9 — ABNORMAL HIGH (ref 5.0–8.0)

## 2018-04-11 LAB — HCG, QUANTITATIVE, PREGNANCY: hCG, Beta Chain, Quant, S: <1 m[IU]/mL (ref <5)

## 2018-04-11 LAB — LIPASE, BLOOD: LIPASE: 32 U/L (ref 11–51)

## 2018-04-11 MED ORDER — METOCLOPRAMIDE HCL 5 MG/ML IJ SOLN
10.0000 mg | Freq: Once | INTRAMUSCULAR | Status: AC
  Administered 2018-04-11: 10 mg via INTRAVENOUS
  Filled 2018-04-11: qty 2

## 2018-04-11 MED ORDER — KETOROLAC TROMETHAMINE 30 MG/ML IJ SOLN
30.0000 mg | Freq: Once | INTRAMUSCULAR | Status: AC
  Administered 2018-04-11: 30 mg via INTRAVENOUS
  Filled 2018-04-11: qty 1

## 2018-04-11 MED ORDER — SODIUM CHLORIDE 0.9 % IV BOLUS
1000.0000 mL | Freq: Once | INTRAVENOUS | Status: AC
  Administered 2018-04-11: 1000 mL via INTRAVENOUS

## 2018-04-11 MED ORDER — DEXAMETHASONE SODIUM PHOSPHATE 10 MG/ML IJ SOLN
10.0000 mg | Freq: Once | INTRAMUSCULAR | Status: AC
  Administered 2018-04-11: 10 mg via INTRAVENOUS
  Filled 2018-04-11: qty 1

## 2018-04-11 NOTE — Discharge Instructions (Signed)
Please follow with your primary care doctor in the next 2 days for a check-up. They must obtain records for further management.  ° °Do not hesitate to return to the Emergency Department for any new, worsening or concerning symptoms.  ° °

## 2018-04-11 NOTE — ED Notes (Signed)
D/c reviewed with patient 

## 2018-04-11 NOTE — ED Provider Notes (Signed)
[facility] EMERGENCY DEPARTMENT Provider Note   CSN: [id number] Arrival date & time: 04/11/18  0915     History   Chief Complaint Chief Complaint  Patient presents with  . Emesis  . Nausea  . Headache   HPI   Blood pressure 136/77, pulse 65, temperature (!) 97.5 F (36.4 C), temperature source Oral, resp. rate 18, height 4\' 10"  (1.473 m), weight 55.8 kg, SpO2 100 %, currently breastfeeding.  Veronica Shea is a 27 y.o. female complaining of frontal headache onset this morning which she rates at 5 out of 10, she has several episodes of nonbloody, nonbilious, non-coffee-ground emesis.  She has a history of headaches which she gets approximately monthly.  No fever, chills, stiff neck, chest pain, shortness of breath, diarrhea, abdominal pain, sick contacts. Pt denies fever, rash, confusion, cervicalgia, LOC/syncope, change in vision, numbness, weakness, dysarthria, ataxia, thunderclap onset, exacerbation with exertion or valsalva, exacerbation in morning, CP, SOB, abdominal pain.   Past Medical History:  Diagnosis Date  . Herpes     Patient Active Problem List   Diagnosis Date Noted  . Active labor at term 01/15/2014  . History of herpes genitalis 01/13/2014  . Supervision of high-risk pregnancy 08/19/2013  . IUGR (intrauterine growth restriction) in prior pregnancy, pregnant 08/19/2013    Past Surgical History:  Procedure Laterality Date  . NO PAST SURGERIES       OB History    Gravida  2   Para  2   Term  2   Preterm  0   AB  0   Living  2     SAB  0   TAB  0   Ectopic  0   Multiple  0   Live Births  2            Home Medications    Prior to Admission medications   Medication Sig Start Date End Date Taking? Authorizing Provider  norethindrone (MICRONOR,CAMILA,ERRIN) 0.35 MG tablet Take 1 tablet (0.35 mg total) by mouth daily. Take at the same time every day 02/17/14   Nani Ravens, MD  Prenatal Vit-Fe Fumarate-FA  (PRENATAL MULTIVITAMIN) TABS tablet Take 1 tablet by mouth daily at 12 noon.    [provider]    Family History Family History  Problem Relation Age of Onset  . Hypertension Father   . Diabetes Sister     Social History Social History   Tobacco Use  . Smoking status: Never Smoker  . Smokeless tobacco: Never Used  Substance Use Topics  . Alcohol use: No  . Drug use: No     Allergies   Patient has no known allergies.   Review of Systems Review of Systems  A complete review of systems was obtained and all systems are negative except as noted in the HPI and PMH.    Physical Exam Updated Vital Signs BP 136/77 (BP Location: Left Arm)   Pulse 65   Temp (!) 97.5 F (36.4 C) (Oral)   Resp 18   Ht 4\' 10"  (1.473 m)   Wt 55.8 kg   SpO2 100%   BMI 25.71 kg/m   Physical Exam  Constitutional: She is oriented to person, place, and time. She appears well-developed and well-nourished.  HENT:  Head: Normocephalic and atraumatic.  Mouth/Throat: Oropharynx is clear and moist.  Eyes: Pupils are equal, round, and reactive to light. Conjunctivae and EOM are normal.  No TTP of maxillary or frontal sinuses  No TTP  or induration of temporal arteries bilaterally  Neck: Normal range of motion. Neck supple.  FROM to C-spine. Pt can touch chin to chest without discomfort. No TTP of midline cervical spine.   Cardiovascular: Normal rate, regular rhythm and intact distal pulses.  Pulmonary/Chest: Effort normal and breath sounds normal. No respiratory distress. She has no wheezes. She has no rales. She exhibits no tenderness.  Abdominal: Soft. Bowel sounds are normal. There is no tenderness.  Musculoskeletal: Normal range of motion. She exhibits no edema or tenderness.  Neurological: She is alert and oriented to person, place, and time. No cranial nerve deficit.  II-Visual fields grossly intact. III/IV/VI-Extraocular movements intact.  Pupils reactive bilaterally. V/VII-Smile  symmetric, equal eyebrow raise,  facial sensation intact VIII- Hearing grossly intact IX/X-Normal gag XI-bilateral shoulder shrug XII-midline tongue extension Motor: 5/5 bilaterally with normal tone and bulk Cerebellar: Normal finger-to-nose  and normal heel-to-shin test.   Romberg negative Ambulates with a coordinated gait   Nursing note and vitals reviewed.    ED Treatments / Results  Labs (all labs ordered are listed, but only abnormal results are displayed) Labs Reviewed  COMPREHENSIVE METABOLIC PANEL - Abnormal; Notable for the following components:      Result Value   CO2 20 (*)    All other components within normal limits  URINALYSIS, ROUTINE W REFLEX MICROSCOPIC - Abnormal; Notable for the following components:   APPearance HAZY (*)    pH 9.0 (*)    All other components within normal limits  LIPASE, BLOOD  CBC  HCG, QUANTITATIVE, PREGNANCY    EKG None  Radiology No results found.  Procedures Procedures (including critical care time)  Medications Ordered in ED Medications  metoCLOPramide (REGLAN) injection 10 mg (10 mg Intravenous Given 04/11/18 1024)  sodium chloride 0.9 % bolus 1,000 mL (1,000 mLs Intravenous New Bag/Given 04/11/18 1024)  dexamethasone (DECADRON) injection 10 mg (10 mg Intravenous Given 04/11/18 1025)  ketorolac (TORADOL) 30 MG/ML injection 30 mg (30 mg Intravenous Given 04/11/18 1025)     Initial Impression / Assessment and Plan / ED Course  I have reviewed the triage vital signs and the nursing notes.  Pertinent labs & imaging results that were available during my care of the patient were reviewed by me and considered in my medical decision making (see chart for details).     Vitals:   04/11/18 0920 04/11/18 0925  BP: 136/77   Pulse: 65   Resp: 18   Temp: (!) 97.5 F (36.4 C)   TempSrc: Oral   SpO2: 100%   Weight:  55.8 kg  Height:  4\' 10"  (1.473 m)    Medications  metoCLOPramide (REGLAN) injection 10 mg (10 mg Intravenous  Given 04/11/18 1024)  sodium chloride 0.9 % bolus 1,000 mL (1,000 mLs Intravenous New Bag/Given 04/11/18 1024)  dexamethasone (DECADRON) injection 10 mg (10 mg Intravenous Given 04/11/18 1025)  ketorolac (TORADOL) 30 MG/ML injection 30 mg (30 mg Intravenous Given 04/11/18 1025)    Veronica Shea is 27 y.o. female presenting with HA. Presentation is like pts typical HA and non concerning for [facility], ICH, Meningitis, or temporal arteritis. Pt is afebrile with no focal neuro deficits, nuchal rigidity, or change in vision. Pt is to follow up with PCP to discuss prophylactic medication. Pt verbalizes understanding and is agreeable with plan to dc.   Evaluation does not show pathology that would require ongoing emergent intervention or inpatient treatment. Pt is hemodynamically stable and mentating appropriately. Discussed findings and plan with patient/guardian, who  agrees with care plan. All questions answered. Return precautions discussed and outpatient follow up given.      Final Clinical Impressions(s) / ED Diagnoses   Final diagnoses:  None    ED Discharge Orders    None       Lynetta Mare Mardella Layman 04/11/18 1241    Tilden Fossa, MD 04/12/18 [phone]

## 2018-04-11 NOTE — ED Triage Notes (Signed)
Pt. Stated, I woke up this morning with headache with N/V

## 2018-05-15 DIAGNOSIS — N760 Acute vaginitis: Secondary | ICD-10-CM | POA: Diagnosis not present

## 2018-05-15 DIAGNOSIS — Z124 Encounter for screening for malignant neoplasm of cervix: Secondary | ICD-10-CM | POA: Diagnosis not present

## 2018-05-15 DIAGNOSIS — F411 Generalized anxiety disorder: Secondary | ICD-10-CM | POA: Diagnosis not present

## 2018-05-15 DIAGNOSIS — N644 Mastodynia: Secondary | ICD-10-CM | POA: Diagnosis not present

## 2018-06-19 DIAGNOSIS — H5203 Hypermetropia, bilateral: Secondary | ICD-10-CM | POA: Diagnosis not present

## 2018-06-19 DIAGNOSIS — H52223 Regular astigmatism, bilateral: Secondary | ICD-10-CM | POA: Diagnosis not present

## 2018-12-14 ENCOUNTER — Encounter (HOSPITAL_COMMUNITY): Payer: Self-pay

## 2018-12-14 ENCOUNTER — Ambulatory Visit (HOSPITAL_COMMUNITY)
Admission: EM | Admit: 2018-12-14 | Discharge: 2018-12-14 | Disposition: A | Discharge status: Home / Self Care | Payer: Medicaid Other | Attending: Family Medicine | Admitting: Family Medicine

## 2018-12-14 ENCOUNTER — Other Ambulatory Visit: Payer: Self-pay

## 2018-12-14 ENCOUNTER — Ambulatory Visit (INDEPENDENT_AMBULATORY_CARE_PROVIDER_SITE_OTHER): Payer: Medicaid Other

## 2018-12-14 DIAGNOSIS — R079 Chest pain, unspecified: Secondary | ICD-10-CM

## 2018-12-14 DIAGNOSIS — R0789 Other chest pain: Secondary | ICD-10-CM

## 2018-12-14 MED ORDER — NAPROXEN 500 MG PO TABS: 500.0000 mg | ORAL_TABLET | Freq: Two times a day (BID) | ORAL | 0 refills | Status: DC

## 2018-12-14 NOTE — ED Provider Notes (Signed)
[facility]    CSN: [id number] Arrival date & time: 12/14/18  1233     History   Chief Complaint Chief Complaint  Patient presents with  . Shoulder Pain    HPI Veronica Shea is a 28 y.o. female.   Veronica Shea presents with complaints of left sided chest pain which radiates to left shoulder, upper back and left neck. Started 5/7 while she was trying to sleep and rolled over. Has been intermittent ever since. Worse when she is trying to sleep. She took Nyquil sleeping medication the past two nights which have helped. She feels pain with deep inspiration. No cough or URI symptoms. No fevers. States it improves when she is active and is able "to forget about it."  She does experience increased pain if she bends or lifts with her left arm, such as doing laundry or carrying her purse with her left arm. No nausea or vomiting. No recent travel. No recent immobilization. She has a Mirena IUD, no oral contraceptives. No history of blood clot. Denies leg pain or swelling.  She thinks her father had a blood clot but she denies that he requires any sort of blood thinner for this. She feels the pain currently but it is less severe than it has been at night. No other medications for symptoms. Without contributing medical history.      ROS per HPI, negative if not otherwise mentioned.      Past Medical History:  Diagnosis Date  . Herpes     Patient Active Problem List   Diagnosis Date Noted  . Active labor at term 01/15/2014  . History of herpes genitalis 01/13/2014  . Supervision of high-risk pregnancy 08/19/2013  . IUGR (intrauterine growth restriction) in prior pregnancy, pregnant 08/19/2013    Past Surgical History:  Procedure Laterality Date  . NO PAST SURGERIES      OB History    Gravida  2   Para  2   Term  2   Preterm  0   AB  0   Living  2     SAB  0   TAB  0   Ectopic  0   Multiple  0   Live Births  2             Home Medications    Prior to Admission medications   Medication Sig Start Date End Date Taking? Authorizing Provider  levonorgestrel (MIRENA) 20 MCG/24HR IUD 1 each by Intrauterine route once.   Yes [provider]  naproxen (NAPROSYN) 500 MG tablet Take 1 tablet (500 mg total) by mouth 2 (two) times daily. 12/14/18   Georgetta Haber, NP    Family History Family History  Problem Relation Age of Onset  . Hypertension Father   . Diabetes Sister     Social History Social History   Tobacco Use  . Smoking status: Never Smoker  . Smokeless tobacco: Never Used  Substance Use Topics  . Alcohol use: No  . Drug use: No     Allergies   Patient has no known allergies.   Review of Systems Review of Systems   Physical Exam Triage Vital Signs ED Triage Vitals  Enc Vitals Group     BP 12/14/18 1251 114/78     Pulse Rate 12/14/18 1251 68     Resp 12/14/18 1251 18     Temp 12/14/18 1251 98.4 F (36.9 C)     Temp Source 12/14/18 1251 Oral  SpO2 12/14/18 1251 100 %     Weight --      Height --      Head Circumference --      Peak Flow --      Pain Score 12/14/18 1250 7     Pain Loc --      Pain Edu? --      Excl. in GC? --    No data found.  Updated Vital Signs BP 114/78 (BP Location: Left Arm)   Pulse 68   Temp 98.4 F (36.9 C) (Oral)   Resp 18   SpO2 100%   Physical Exam Constitutional:      General: She is not in acute distress.    Appearance: She is well-developed.  Cardiovascular:     Rate and Rhythm: Normal rate and regular rhythm.     Heart sounds: Normal heart sounds.  Pulmonary:     Effort: Pulmonary effort is normal.     Breath sounds: Normal breath sounds.  Chest:     Chest wall: Tenderness present. No mass or deformity.       Comments: Left lateral chest wall / left anterior shoulder with tenderness on palpation and pain with external rotation of left shoulder; patient indicates that she feels pain underlying left breast as well   Musculoskeletal:     Left shoulder: She exhibits tenderness and pain. She exhibits normal range of motion, no bony tenderness, no swelling, no effusion, no crepitus, no deformity, no laceration, no spasm, normal pulse and normal strength.       Arms:     Comments: Calves soft and non tender   Skin:    General: Skin is warm and dry.  Neurological:     Mental Status: She is alert and oriented to person, place, and time.    EKG: NSR rate 62 . Previous EKG was not available for review. No stwave changes as interpreted by me.    UC Treatments / Results  Labs (all labs ordered are listed, but only abnormal results are displayed) Labs Reviewed - No data to display  EKG None  Radiology Dg Chest 2 View  Result Date: 12/14/2018 CLINICAL DATA:  Left-sided chest discomfort over the last 2 days. EXAM: CHEST - 2 VIEW COMPARISON:  None. FINDINGS: Heart size is normal. Mediastinal shadows are normal. The lungs are clear. No bronchial thickening. No infiltrate, mass, effusion or collapse. Pulmonary vascularity is normal. No bony abnormality. IMPRESSION: Normal chest Electronically Signed   By: Paulina FusiMark  Shogry M.D.   On: 12/14/2018 13:43    Procedures Procedures (including critical care time)  Medications Ordered in UC Medications - No data to display  Initial Impression / Assessment and Plan / UC Course  I have reviewed the triage vital signs and the nursing notes.  Pertinent labs & imaging results that were available during my care of the patient were reviewed by me and considered in my medical decision making (see chart for details).     Pain is reproducible with palpation and with movement. No tachycardia, no hypoxia or tachypnea. No distress noted. No cardiac or PE risk factors. ekg normal here today. Chest xray without acute findings. MSK vs anxiety related chest pain considered. Naproxen provided to try to help with pain management. Encouraged follow up with PCP for any persistent  symptoms with strict ER precautions provided. Patient verbalized understanding and agreeable to plan.  Ambulatory out of clinic without difficulty.   Final Clinical Impressions(s) / UC Diagnoses  Final diagnoses:  Left-sided chest wall pain     Discharge Instructions     Your EKG and chest xray are reassuring here today.  You may continue to use medications as needed to help you sleep.  Naproxen twice a day may help with pain also, take with food.  Please follow up with your primary care provider if symptoms persist.  If develop worsening of pain, shortness of breath , difficulty breathing, fever weakness, dizziness or otherwise worsening please go to the ER.     ED Prescriptions    Medication Sig Dispense Auth. Provider   naproxen (NAPROSYN) 500 MG tablet Take 1 tablet (500 mg total) by mouth 2 (two) times daily. 30 tablet Georgetta Haber, NP     Controlled Substance Prescriptions [place] Controlled Substance Registry consulted? Not Applicable   Georgetta Haber, NP 12/14/18 1347

## 2018-12-14 NOTE — ED Triage Notes (Signed)
Patient presents to Urgent Care with complaints of intermittent left sided shoulder/chest discomfort since 2 days ago, pain is worsened with movement. Patient reports she has been trying icy hot cream with no improvement.

## 2018-12-14 NOTE — Discharge Instructions (Addendum)
Your EKG and chest xray are reassuring here today.  You may continue to use medications as needed to help you sleep.  Naproxen twice a day may help with pain also, take with food.  Please follow up with your primary care provider if symptoms persist.  If develop worsening of pain, shortness of breath , difficulty breathing, fever weakness, dizziness or otherwise worsening please go to the ER.

## 2019-05-04 DIAGNOSIS — Z3009 Encounter for other general counseling and advice on contraception: Secondary | ICD-10-CM | POA: Diagnosis not present

## 2019-05-04 DIAGNOSIS — Z1388 Encounter for screening for disorder due to exposure to contaminants: Secondary | ICD-10-CM | POA: Diagnosis not present

## 2019-05-04 DIAGNOSIS — Z0389 Encounter for observation for other suspected diseases and conditions ruled out: Secondary | ICD-10-CM | POA: Diagnosis not present

## 2019-05-04 DIAGNOSIS — Z30432 Encounter for removal of intrauterine contraceptive device: Secondary | ICD-10-CM | POA: Diagnosis not present

## 2019-05-04 DIAGNOSIS — Z01419 Encounter for gynecological examination (general) (routine) without abnormal findings: Secondary | ICD-10-CM | POA: Diagnosis not present

## 2019-08-06 NOTE — L&D Delivery Note (Signed)
Delivery Note Veronica Shea is a 29 y.o. [id number] at [redacted]w[redacted]d admitted for IOL d/t pre-e w/o severe features.   GBS Status:  Positive/-- (04/20 0000) Maximum Maternal Temperature: 98.4  Labor course: Initial SVE: 3/60/-1. Augmentation with: AROM, Pitocin and IP Foley. She then progressed to complete.  ROM: 8h 71m with clear fluid  Birth: Pt progressed rapidly and at 2244 a viable female was delivered via spontaneous vaginal delivery (Presentation: LOA). Nuchal cord present: No.  Shoulders and body delivered in usual fashion. Infant placed directly on mom's abdomen for bonding/skin-to-skin, baby dried and stimulated. Cord clamped x 2 after 1 minute and cut by FOB.  Cord blood collected.  The placenta separated spontaneously and delivered via gentle cord traction.  Pitocin infused rapidly IV per protocol.  Fundus firm with massage.  Placenta inspected and appears to be intact with a 3 VC.  Placenta/Cord with the following complications: none .  Cord pH: not done Sponge and instrument count were correct x2.  Intrapartum complications:  None Anesthesia:  IV Fentanyl x 1 Episiotomy: none Lacerations:  none Suture Repair: n/a EBL (mL): 200   Infant: APGAR (1 MIN): 8   APGAR (5 MINS): 9   APGAR (10 MINS):    Infant weight: pending  Mom to postpartum.  Baby to Couplet care / Skin to Skin. Placenta to L&D   Plans to Breast and bottlefeed Contraception: tubal ligation, scheduled for 10/20 @ 1400, consent signed 8/2, NPO after 0800 Circumcision: N/A  Note sent to [facility]: [facility] for pp visit.  Cheral Marker CNM, [facility] 05/23/2020 11:02 PM

## 2019-10-20 ENCOUNTER — Other Ambulatory Visit: Payer: Self-pay

## 2019-10-20 ENCOUNTER — Ambulatory Visit (INDEPENDENT_AMBULATORY_CARE_PROVIDER_SITE_OTHER): Payer: Medicaid Other | Admitting: *Deleted

## 2019-10-20 DIAGNOSIS — Z3201 Encounter for pregnancy test, result positive: Secondary | ICD-10-CM

## 2019-10-20 DIAGNOSIS — O0990 Supervision of high risk pregnancy, unspecified, unspecified trimester: Secondary | ICD-10-CM

## 2019-10-20 DIAGNOSIS — Z3200 Encounter for pregnancy test, result unknown: Secondary | ICD-10-CM

## 2019-10-20 LAB — POCT PREGNANCY, URINE: Preg Test, Ur: POSITIVE — AB

## 2019-10-20 MED ORDER — PRENATAL 27-0.8 MG PO TABS: 1.0000 | ORAL_TABLET | Freq: Every day | ORAL | 12 refills | Status: DC

## 2019-10-20 NOTE — Progress Notes (Signed)
Patient seen and assessed by nursing staff during this encounter. I have reviewed the chart and agree with the documentation and plan.  Jaynie Collins, MD 10/20/2019 11:35 AM

## 2019-10-20 NOTE — Progress Notes (Signed)
Here for pregnancy test which was positive. She stated LMP was 08/17/19 this makes her 105w1d with EDD 05/23/20. Advised to start prenatal care. She has hx IUGR and seen by [place] before. She would like to start care with [place]. RX for PNV sent in. Sent to registrar to schedule appointments.  Quinzell Malcomb,RN

## 2019-11-15 ENCOUNTER — Other Ambulatory Visit: Payer: Self-pay

## 2019-11-15 ENCOUNTER — Emergency Department (HOSPITAL_COMMUNITY)
Admission: EM | Admit: 2019-11-15 | Discharge: 2019-11-15 | Disposition: A | Discharge status: Home / Self Care | Payer: Medicaid Other | Attending: Emergency Medicine | Admitting: Emergency Medicine

## 2019-11-15 ENCOUNTER — Encounter (HOSPITAL_COMMUNITY): Payer: Self-pay | Admitting: *Deleted

## 2019-11-15 DIAGNOSIS — O26892 Other specified pregnancy related conditions, second trimester: Secondary | ICD-10-CM | POA: Diagnosis not present

## 2019-11-15 DIAGNOSIS — Z3A13 13 weeks gestation of pregnancy: Secondary | ICD-10-CM | POA: Diagnosis not present

## 2019-11-15 DIAGNOSIS — R519 Headache, unspecified: Secondary | ICD-10-CM | POA: Diagnosis not present

## 2019-11-15 MED ORDER — ACETAMINOPHEN 325 MG PO TABS
650.0000 mg | ORAL_TABLET | Freq: Once | ORAL | Status: AC
  Administered 2019-11-15: 09:00:00 650 mg via ORAL
  Filled 2019-11-15: qty 2

## 2019-11-15 NOTE — ED Provider Notes (Signed)
[facility] EMERGENCY DEPARTMENT Provider Note   CSN: [id number] Arrival date & time: 11/15/19  1829     History Chief Complaint  Patient presents with  . Headache    Veronica Shea is a 29 y.o. female.  29 y.o female G3P2 currently [redacted] weeks pregnant with a PMH of Herpes presents to the ED with a chief complaint of headache since this morning. Patient reports waking up with a frontal right sided head with radiation to the left side of her head. She states being unable to sleep due to the pain. She does have a prior history of migraines, states she usually is medicated with Excedrin Migraine however was told by OB/GYN that she could not take this medication during pregnancy.  Patient endorses nausea along with vomiting, reports she attributes this to pregnancy.  She has not tried any medication for improvement in her symptoms.  There is no changes in vision, swelling to her legs, dizziness or weakness.  The history is provided by the patient and medical records.  Headache Associated symptoms: nausea   Associated symptoms: no fever and no vomiting        Past Medical History:  Diagnosis Date  . Herpes     Patient Active Problem List   Diagnosis Date Noted  . Active labor at term 01/15/2014  . History of herpes genitalis 01/13/2014  . Supervision of high-risk pregnancy 08/19/2013  . IUGR (intrauterine growth restriction) in prior pregnancy, pregnant 08/19/2013    Past Surgical History:  Procedure Laterality Date  . NO PAST SURGERIES       OB History    Gravida  3   Para  2   Term  2   Preterm  0   AB  0   Living  2     SAB  0   TAB  0   Ectopic  0   Multiple  0   Live Births  2           Family History  Problem Relation Age of Onset  . Hypertension Father   . Diabetes Sister     Social History   Tobacco Use  . Smoking status: Never Smoker  . Smokeless tobacco: Never Used  Substance Use Topics  . Alcohol use:  No  . Drug use: No    Home Medications Prior to Admission medications   Medication Sig Start Date End Date Taking? Authorizing Provider  levonorgestrel (MIRENA) 20 MCG/24HR IUD 1 each by Intrauterine route once.    [provider]  naproxen (NAPROSYN) 500 MG tablet Take 1 tablet (500 mg total) by mouth 2 (two) times daily. 12/14/18   Zigmund Gottron, NP  Prenatal Vit-Fe Fumarate-FA (MULTIVITAMIN-PRENATAL) 27-0.8 MG TABS tablet Take 1 tablet by mouth daily at 12 noon. 10/20/19   Osborne Oman, MD    Allergies    Patient has no known allergies.  Review of Systems   Review of Systems  Constitutional: Negative for fever.  Respiratory: Negative for shortness of breath.   Cardiovascular: Negative for chest pain.  Gastrointestinal: Positive for nausea. Negative for vomiting.  Genitourinary: Negative for flank pain.  Neurological: Positive for headaches. Negative for light-headedness.  All other systems reviewed and are negative.   Physical Exam Updated Vital Signs BP 104/71   Pulse 83   Temp 97.7 F (36.5 C) (Oral)   Resp 16   SpO2 98%   Physical Exam Vitals and nursing note reviewed.  Constitutional:  General: She is not in acute distress.    Appearance: She is well-developed.  HENT:     Head: Normocephalic and atraumatic.     Mouth/Throat:     Pharynx: No oropharyngeal exudate.  Eyes:     Pupils: Pupils are equal, round, and reactive to light.  Cardiovascular:     Rate and Rhythm: Regular rhythm.     Heart sounds: Normal heart sounds.  Pulmonary:     Effort: Pulmonary effort is normal. No respiratory distress.     Breath sounds: Normal breath sounds.  Abdominal:     General: Bowel sounds are normal. There is no distension.     Palpations: Abdomen is soft.     Tenderness: There is no abdominal tenderness.  Musculoskeletal:        General: No tenderness or deformity.     Cervical back: Normal range of motion.     Right lower leg: No edema.     Left  lower leg: No edema.  Skin:    General: Skin is warm and dry.  Neurological:     Mental Status: She is alert and oriented to person, place, and time.     Comments: Alert, oriented, thought content appropriate. Speech fluent without evidence of aphasia. Able to follow 2 step commands without difficulty.  Cranial Nerves:  II:  Peripheral visual fields grossly normal, pupils, round, reactive to light III,IV, VI: ptosis not present, extra-ocular motions intact bilaterally  V,VII: smile symmetric, facial light touch sensation equal VIII: hearing grossly normal bilaterally  IX,X: midline uvula rise  XI: bilateral shoulder shrug equal and strong XII: midline tongue extension  Motor:  5/5 in upper and lower extremities bilaterally including strong and equal grip strength and dorsiflexion/plantar flexion Sensory: light touch normal in all extremities.  Cerebellar: normal finger-to-nose with bilateral upper extremities, pronator drift negative Gait: normal gait and balance      ED Results / Procedures / Treatments   Labs (all labs ordered are listed, but only abnormal results are displayed) Labs Reviewed - No data to display  EKG None  Radiology No results found.  Procedures Procedures (including critical care time)  Medications Ordered in ED Medications  acetaminophen (TYLENOL) tablet 650 mg (650 mg Oral Given 11/15/19 0910)    ED Course  I have reviewed the triage vital signs and the nursing notes.  Pertinent labs & imaging results that were available during my care of the patient were reviewed by me and considered in my medical decision making (see chart for details).    MDM Rules/Calculators/A&P   Patient with a past medical herpes and migraines presents to the ED with complaints of headache which began this morning upon waking up.  Endorses a headache began on the left side of her head and then radiated to the right side of her head.  She had nausea medication for  improvement in her symptoms.  Denies any photophobia, blurry vision, denies this feeling like a prior migraine.  Does report she is currently [redacted] weeks pregnant with her third child.  No weakness, dizziness, does endorse nausea and vomiting but attributes this to pregnancy.  Patient does report that last night she was playing with her daughters when she was struck in the nose by one of them, does report pain to the area, and is having some nasal congestion with this.  She reports she usually gets headaches however was told to not take Excedrin migraine due to pregnancy.  She is neurologically intact, no deficit  on my exam.  Ambulatory in the ED with a steady gait.  Vitals are within normal limits, no HTN, no swelling to extremities.  Provided with Tylenol for pain control.  11:02 AM patient reevaluated by me, reports resolution of headache after Tylenol.  She is in stable condition, neurologically intact.  Prior history of high risk pregnancy, no gynecological complaints on today's visit.  Patient stable for discharge.  Portions of this note were generated with Scientist, clinical (histocompatibility and immunogenetics). Dictation errors may occur despite best attempts at proofreading.  Final Clinical Impression(s) / ED Diagnoses Final diagnoses:  Bad headache    Rx / DC Orders ED Discharge Orders    None       Claude Manges, PA-C 11/15/19 1103    Pricilla Loveless, MD 11/17/19 [phone]

## 2019-11-15 NOTE — Discharge Instructions (Addendum)
Please continue to hydrate with plenty of liquids to prevent any headaches.  You may take Tylenol for symptomatic control at home.  Avoid Excedrin.  Follow-up with your OB/GYN as needed.

## 2019-11-15 NOTE — ED Triage Notes (Signed)
Pt reports waking up with headache this am. Denies n/v. Pt has hx of migraines but has not taken any meds due to being approx [redacted] weeks pregnant.

## 2019-11-23 ENCOUNTER — Encounter: Payer: Self-pay | Admitting: Obstetrics & Gynecology

## 2019-11-23 ENCOUNTER — Other Ambulatory Visit: Payer: Self-pay

## 2019-11-23 ENCOUNTER — Ambulatory Visit (INDEPENDENT_AMBULATORY_CARE_PROVIDER_SITE_OTHER): Payer: Medicaid Other | Admitting: Obstetrics & Gynecology

## 2019-11-23 ENCOUNTER — Other Ambulatory Visit (HOSPITAL_COMMUNITY)
Admission: RE | Admit: 2019-11-23 | Discharge: 2019-11-23 | Disposition: A | Discharge status: Home / Self Care | Payer: Medicaid Other | Source: Ambulatory Visit | Attending: Obstetrics & Gynecology | Admitting: Obstetrics & Gynecology

## 2019-11-23 VITALS — BP 105/62 | HR 70 | Wt 123.9 lb

## 2019-11-23 DIAGNOSIS — R8271 Bacteriuria: Secondary | ICD-10-CM

## 2019-11-23 DIAGNOSIS — Z3481 Encounter for supervision of other normal pregnancy, first trimester: Secondary | ICD-10-CM | POA: Insufficient documentation

## 2019-11-23 DIAGNOSIS — Z315 Encounter for genetic counseling: Secondary | ICD-10-CM | POA: Diagnosis not present

## 2019-11-23 DIAGNOSIS — O09291 Supervision of pregnancy with other poor reproductive or obstetric history, first trimester: Secondary | ICD-10-CM

## 2019-11-23 DIAGNOSIS — Z3482 Encounter for supervision of other normal pregnancy, second trimester: Secondary | ICD-10-CM

## 2019-11-23 DIAGNOSIS — O98511 Other viral diseases complicating pregnancy, first trimester: Secondary | ICD-10-CM

## 2019-11-23 DIAGNOSIS — Z3A14 14 weeks gestation of pregnancy: Secondary | ICD-10-CM

## 2019-11-23 DIAGNOSIS — R7989 Other specified abnormal findings of blood chemistry: Secondary | ICD-10-CM

## 2019-11-23 DIAGNOSIS — O09299 Supervision of pregnancy with other poor reproductive or obstetric history, unspecified trimester: Secondary | ICD-10-CM

## 2019-11-23 DIAGNOSIS — Z8619 Personal history of other infectious and parasitic diseases: Secondary | ICD-10-CM

## 2019-11-23 LAB — POCT URINALYSIS DIP (DEVICE)
Bilirubin Urine: NEGATIVE
Glucose, UA: NEGATIVE mg/dL
Ketones, ur: NEGATIVE mg/dL
Nitrite: NEGATIVE
Protein, ur: NEGATIVE mg/dL
Specific Gravity, Urine: 1.025 (ref 1.005–1.030)
Urobilinogen, UA: 0.2 mg/dL (ref 0.0–1.0)
pH: 6 (ref 5.0–8.0)

## 2019-11-23 LAB — OB RESULTS CONSOLE GBS: GBS: POSITIVE

## 2019-11-23 NOTE — Patient Instructions (Signed)

## 2019-11-23 NOTE — Progress Notes (Signed)
History:   Veronica Shea is a 29 y.o. G3P2002 at [redacted]w[redacted]d by LMP being seen today for her first obstetrical visit.  Her obstetrical history is significant for history of intrauterine growth restriction (IUGR) in first pregnancy. Patient does intend to breast feed. Pregnancy history fully reviewed.  Patient reports no significant complaints.      HISTORY: OB History  Gravida Para Term Preterm AB Living  3 2 2  0 0 2  SAB TAB Ectopic Multiple Live Births  0 0 0 0 2    # Outcome Date GA Lbr Len/2nd Weight Sex Delivery Anes PTL Lv  3 Current           2 Term 01/15/14 [redacted]w[redacted]d 11:03 / 00:35 6 lb 10.2 oz (3.01 kg) F Vag-Spont None  LIV     Name: Veronica Shea, Veronica Shea     Apgar1: 9  Apgar5: 9  1 Term 01/17/10 [redacted]w[redacted]d 23:00 5 lb 5 oz (2.41 kg) F Vag-Spont None  LIV    Last pap smear was done 04/2019 at [facility] and was normal  Past Medical History:  Diagnosis Date  . Genital herpes    Past Surgical History:  Procedure Laterality Date  . NO PAST SURGERIES     Family History  Problem Relation Age of Onset  . Hypertension Father   . Diabetes Sister    Social History   Tobacco Use  . Smoking status: Never Smoker  . Smokeless tobacco: Never Used  Substance Use Topics  . Alcohol use: No  . Drug use: No   No Known Allergies Current Outpatient Medications on File Prior to Visit  Medication Sig Dispense Refill  . Prenatal Vit-Fe Fumarate-FA (MULTIVITAMIN-PRENATAL) 27-0.8 MG TABS tablet Take 1 tablet by mouth daily at 12 noon. 30 tablet 12  . valACYclovir (VALTREX) 1000 MG tablet Take 1,000 mg by mouth daily.     No current facility-administered medications on file prior to visit.    Review of Systems Pertinent items noted in HPI and remainder of comprehensive ROS otherwise negative. Physical Exam:   Vitals:   11/23/19 0907  BP: 105/62  Pulse: 70  Weight: 123 lb 14.4 oz (56.2 kg)  Bedside Ultrasound for FHR check: 160 bpm   General: well-developed, well-nourished female  in no acute distress  Breasts:  deferred  Skin: normal coloration and turgor, no rashes  Neurologic: oriented, normal, negative, normal mood  Extremities: normal strength, tone, and muscle mass, ROM of all joints is normal  HEENT PERRLA, extraocular movement intact and sclera clear, anicteric  Mouth/Teeth mucous membranes moist, pharynx normal without lesions and dental hygiene good  Neck supple and no masses  Cardiovascular: regular rate and rhythm  Respiratory:  no respiratory distress, normal breath sounds  Abdomen: soft, non-tender; bowel sounds normal; no masses,  no organomegaly    Assessment:    Pregnancy: [id number] Patient Active Problem List   Diagnosis Date Noted  . History of herpes genitalis 01/13/2014  . Supervision of normal pregnancy 08/19/2013  . Fetal growth restriction in prior pregnancy, pregnant 08/19/2013    Plan:    1. Fetal growth restriction in prior pregnancy, pregnant Will follow up fetal weight  2. History of herpes genitalis On Valtrex suppression  3. Encounter for supervision of other normal pregnancy in second trimester - Comprehensive metabolic panel - Hemoglobin A1c - TSH - Protein / creatinine ratio, urine - Obstetric Panel, Including HIV - Culture, OB Urine - Hepatitis c antibody (reflex) - [place] MFM OB COMP + 14 WK;  Future - GC/Chlamydia probe amp ([place])not at [facility] - [facility] AMB BABYSCRIPTS SCHEDULE OPTIMIZATION - Genetic Screening  Initial labs drawn. Continue prenatal vitamins. Genetic Screening discussed, NIPS: ordered. Ultrasound discussed; fetal anatomic survey: ordered. Problem list reviewed and updated. The nature of [facility] with multiple MDs and other Advanced Practice Providers was explained to patient; also emphasized that residents, students are part of our team. Routine obstetric precautions reviewed. Return in about 4 weeks (around 12/21/2019) for OFFICE LOB Visit.     Jaynie Collins, MD, FACOG Obstetrician & Gynecologist, [facility] for [employer], [facility]

## 2019-11-24 LAB — HEMOGLOBIN A1C
Est. average glucose Bld gHb Est-mCnc: 94 mg/dL
Hgb A1c MFr Bld: 4.9 % (ref 4.8–5.6)

## 2019-11-24 LAB — COMPREHENSIVE METABOLIC PANEL
ALT: 13 IU/L (ref 0–32)
AST: 13 IU/L (ref 0–40)
Albumin/Globulin Ratio: 1.5 (ref 1.2–2.2)
Albumin: 4 g/dL (ref 3.9–5.0)
Alkaline Phosphatase: 86 IU/L (ref 39–117)
BUN/Creatinine Ratio: 10 (ref 9–23)
BUN: 4 mg/dL — ABNORMAL LOW (ref 6–20)
Bilirubin Total: 0.5 mg/dL (ref 0.0–1.2)
CO2: 19 mmol/L — ABNORMAL LOW (ref 20–29)
Calcium: 9.1 mg/dL (ref 8.7–10.2)
Chloride: 103 mmol/L (ref 96–106)
Creatinine, Ser: 0.41 mg/dL — ABNORMAL LOW (ref 0.57–1.00)
GFR calc Af Amer: 160 mL/min/{1.73_m2} (ref >59)
GFR calc non Af Amer: 139 mL/min/{1.73_m2} (ref >59)
Globulin, Total: 2.6 g/dL (ref 1.5–4.5)
Glucose: 74 mg/dL (ref 65–99)
Potassium: 4.1 mmol/L (ref 3.5–5.2)
Sodium: 138 mmol/L (ref 134–144)
Total Protein: 6.6 g/dL (ref 6.0–8.5)

## 2019-11-24 LAB — GC/CHLAMYDIA PROBE AMP (~~LOC~~) NOT AT ARMC
Chlamydia: NEGATIVE
Comment: NEGATIVE
Comment: NORMAL
Neisseria Gonorrhea: NEGATIVE

## 2019-11-24 LAB — OBSTETRIC PANEL, INCLUDING HIV
Antibody Screen: NEGATIVE
Basophils Absolute: 0 10*3/uL (ref 0.0–0.2)
Basos: 0 %
EOS (ABSOLUTE): 0 10*3/uL (ref 0.0–0.4)
Eos: 1 %
HIV Screen 4th Generation wRfx: NONREACTIVE
Hematocrit: 41.1 % (ref 34.0–46.6)
Hemoglobin: 13.9 g/dL (ref 11.1–15.9)
Hepatitis B Surface Ag: NEGATIVE
Immature Grans (Abs): 0 10*3/uL (ref 0.0–0.1)
Immature Granulocytes: 0 %
Lymphocytes Absolute: 2.1 10*3/uL (ref 0.7–3.1)
Lymphs: 26 %
MCH: 30 pg (ref 26.6–33.0)
MCHC: 33.8 g/dL (ref 31.5–35.7)
MCV: 89 fL (ref 79–97)
Monocytes Absolute: 0.3 10*3/uL (ref 0.1–0.9)
Monocytes: 4 %
Neutrophils Absolute: 5.8 10*3/uL (ref 1.4–7.0)
Neutrophils: 69 %
Platelets: 288 10*3/uL (ref 150–450)
RBC: 4.64 x10E6/uL (ref 3.77–5.28)
RDW: 13.6 % (ref 11.7–15.4)
RPR Ser Ql: NONREACTIVE
Rh Factor: POSITIVE
Rubella Antibodies, IGG: 3.72 index (ref >0.99)
WBC: 8.3 10*3/uL (ref 3.4–10.8)

## 2019-11-24 LAB — PROTEIN / CREATININE RATIO, URINE
Creatinine, Urine: 122.4 mg/dL
Protein, Ur: 37.5 mg/dL
Protein/Creat Ratio: 306 mg/g creat — ABNORMAL HIGH (ref 0–200)

## 2019-11-24 LAB — HCV COMMENT:

## 2019-11-24 LAB — HEPATITIS C ANTIBODY (REFLEX): HCV Ab: <0.1 s/co ratio (ref 0.0–0.9)

## 2019-11-24 LAB — TSH: TSH: 0.096 u[IU]/mL — ABNORMAL LOW (ref 0.450–4.500)

## 2019-11-24 NOTE — Addendum Note (Signed)
Addended by: Jaynie Collins A on: 11/24/2019 11:30 AM   Modules accepted: Orders

## 2019-11-25 ENCOUNTER — Encounter: Payer: Self-pay | Admitting: *Deleted

## 2019-11-29 ENCOUNTER — Telehealth: Payer: Self-pay

## 2019-11-29 ENCOUNTER — Encounter: Payer: Self-pay | Admitting: Obstetrics & Gynecology

## 2019-11-29 DIAGNOSIS — R8271 Bacteriuria: Secondary | ICD-10-CM | POA: Insufficient documentation

## 2019-11-29 LAB — URINE CULTURE, OB REFLEX

## 2019-11-29 LAB — CULTURE, OB URINE

## 2019-11-29 MED ORDER — CEFADROXIL 500 MG PO CAPS: 500.0000 mg | ORAL_CAPSULE | Freq: Two times a day (BID) | ORAL | 0 refills | Status: DC

## 2019-11-29 NOTE — Telephone Encounter (Signed)
-----   Message from Tereso Newcomer, MD sent at 11/29/2019 10:05 AM EDT ----- Urine culture is positive for GBS. Cefadroxil prescribed. Please call to inform patient of results and advise her to pick up prescription and take as directed. Also inform her that she will need antibiotics in labor.

## 2019-11-29 NOTE — Addendum Note (Signed)
Addended by: Jaynie Collins A on: 11/29/2019 10:04 AM   Modules accepted: Orders

## 2019-11-29 NOTE — Telephone Encounter (Signed)
Called pt to advise of +GBS results, explained that Provider sent Rx Cefadroxil to pharmacy on file. Explained what GBS was & also informed that she will need antibiotics in labor. Patient verbalized understanding.

## 2019-11-30 ENCOUNTER — Encounter: Payer: Self-pay | Admitting: *Deleted

## 2019-12-07 ENCOUNTER — Other Ambulatory Visit: Payer: Self-pay | Admitting: General Practice

## 2019-12-07 DIAGNOSIS — Z3482 Encounter for supervision of other normal pregnancy, second trimester: Secondary | ICD-10-CM

## 2019-12-07 MED ORDER — BLOOD PRESSURE KIT DEVI: 1.0000 | Freq: Once | 0 refills | Status: AC

## 2019-12-10 DIAGNOSIS — Z3490 Encounter for supervision of normal pregnancy, unspecified, unspecified trimester: Secondary | ICD-10-CM | POA: Diagnosis not present

## 2019-12-13 ENCOUNTER — Encounter: Payer: Self-pay | Admitting: *Deleted

## 2019-12-16 LAB — T3, FREE: T3, Free: 3.8 pg/mL (ref 2.0–4.4)

## 2019-12-16 LAB — T4, FREE: Free T4: 1.15 ng/dL (ref 0.82–1.77)

## 2019-12-16 LAB — SPECIMEN STATUS REPORT

## 2019-12-20 ENCOUNTER — Ambulatory Visit: Payer: Medicaid Other | Attending: Internal Medicine

## 2019-12-20 DIAGNOSIS — Z20822 Contact with and (suspected) exposure to covid-19: Secondary | ICD-10-CM

## 2019-12-21 LAB — SARS-COV-2, NAA 2 DAY TAT

## 2019-12-21 LAB — NOVEL CORONAVIRUS, NAA: SARS-CoV-2, NAA: NOT DETECTED

## 2019-12-23 ENCOUNTER — Telehealth (INDEPENDENT_AMBULATORY_CARE_PROVIDER_SITE_OTHER): Payer: Medicaid Other | Admitting: Family Medicine

## 2019-12-23 VITALS — BP 108/63 | HR 75

## 2019-12-23 DIAGNOSIS — O09292 Supervision of pregnancy with other poor reproductive or obstetric history, second trimester: Secondary | ICD-10-CM

## 2019-12-23 DIAGNOSIS — Z3482 Encounter for supervision of other normal pregnancy, second trimester: Secondary | ICD-10-CM

## 2019-12-23 DIAGNOSIS — Z8619 Personal history of other infectious and parasitic diseases: Secondary | ICD-10-CM

## 2019-12-23 DIAGNOSIS — R8271 Bacteriuria: Secondary | ICD-10-CM

## 2019-12-23 DIAGNOSIS — O09299 Supervision of pregnancy with other poor reproductive or obstetric history, unspecified trimester: Secondary | ICD-10-CM

## 2019-12-23 DIAGNOSIS — O99820 Streptococcus B carrier state complicating pregnancy: Secondary | ICD-10-CM

## 2019-12-23 DIAGNOSIS — Z3A18 18 weeks gestation of pregnancy: Secondary | ICD-10-CM

## 2019-12-23 NOTE — Patient Instructions (Signed)

## 2019-12-23 NOTE — Progress Notes (Signed)
I connected with  [name] on 12/23/19 at 0927 by MyChart and verified that I am speaking with the correct person using two identifiers.   I discussed the limitations, risks, security and privacy concerns of performing an evaluation and management service by telephone and the availability of in person appointments. I also discussed with the patient that there may be a patient responsible charge related to this service. The patient expressed understanding and agreed to proceed.  Marjo Bicker, RN 12/23/2019  9:27 AM

## 2019-12-23 NOTE — Progress Notes (Signed)
    OBSTETRICS PRENATAL VIRTUAL VISIT ENCOUNTER NOTE  Provider location: Center for [facility]   I connected with Veronica Shea on 12/23/19 at  8:55 AM EDT by MyChart Video Encounter at home and verified that I am speaking with the correct person using two identifiers.   I discussed the limitations, risks, security and privacy concerns of performing an evaluation and management service virtually and the availability of in person appointments. I also discussed with the patient that there may be a patient responsible charge related to this service. The patient expressed understanding and agreed to proceed. Subjective:  Veronica Shea is a 29 y.o. G3P2002 at [redacted]w[redacted]d being seen today for ongoing prenatal care.  She is currently monitored for the following issues for this low-risk pregnancy and has Supervision of normal pregnancy; Fetal growth restriction in prior pregnancy, pregnant; History of herpes genitalis; and Group B streptococcal bacteriuria on their problem list.  Patient reports no complaints.  Contractions: Not present. Vag. Bleeding: None.  Movement: Present. Denies any leaking of fluid.   The following portions of the patient's history were reviewed and updated as appropriate: allergies, current medications, past family history, past medical history, past social history, past surgical history and problem list.   Objective:   Vitals:   12/23/19 0928  BP: 108/63  Pulse: 75    Fetal Status:     Movement: Present     General:  Alert, oriented and cooperative. Patient is in no acute distress.  Respiratory: Normal respiratory effort, no problems with respiration noted  Mental Status: Normal mood and affect. Normal behavior. Normal judgment and thought content.  Rest of physical exam deferred due to type of encounter  Imaging: No results found.  Assessment and Plan:  Pregnancy: G3P2002 at [redacted]w[redacted]d 1. Encounter for supervision of other  normal pregnancy in second trimester Continue routine prenatal care. Anatomy scan is scheduled  2. Fetal growth restriction in prior pregnancy, pregnant Will monitor FH  3. Group B streptococcal bacteriuria Will need treatment in labor  4. History of herpes genitalis On Valtrex.  General obstetric precautions including but not limited to vaginal bleeding, contractions, leaking of fluid and fetal movement were reviewed in detail with the patient. I discussed the assessment and treatment plan with the patient. The patient was provided an opportunity to ask questions and all were answered. The patient agreed with the plan and demonstrated an understanding of the instructions. The patient was advised to call back or seek an in-person office evaluation/go to [facility] at [address] [facility] for any urgent or concerning symptoms. Please refer to After Visit Summary for other counseling recommendations.   I provided 9 minutes of face-to-face time during this encounter.  Return in 4 weeks (on 01/20/2020) for virtual, [facility], needs U/S scheduled, AFP (lab visit) with anatomyscan.  No future appointments.  Reva Bores, MD Center for [employer], [facility]

## 2020-01-10 ENCOUNTER — Other Ambulatory Visit: Payer: Self-pay | Admitting: Obstetrics & Gynecology

## 2020-01-10 ENCOUNTER — Ambulatory Visit: Payer: Medicaid Other | Attending: Obstetrics & Gynecology

## 2020-01-10 ENCOUNTER — Other Ambulatory Visit: Payer: Self-pay

## 2020-01-10 DIAGNOSIS — Z3482 Encounter for supervision of other normal pregnancy, second trimester: Secondary | ICD-10-CM | POA: Diagnosis not present

## 2020-01-10 DIAGNOSIS — Z3A20 20 weeks gestation of pregnancy: Secondary | ICD-10-CM

## 2020-01-10 DIAGNOSIS — Z363 Encounter for antenatal screening for malformations: Secondary | ICD-10-CM

## 2020-01-10 DIAGNOSIS — O09292 Supervision of pregnancy with other poor reproductive or obstetric history, second trimester: Secondary | ICD-10-CM | POA: Diagnosis not present

## 2020-01-10 DIAGNOSIS — O4442 Low lying placenta NOS or without hemorrhage, second trimester: Secondary | ICD-10-CM

## 2020-01-11 ENCOUNTER — Other Ambulatory Visit: Payer: Self-pay | Admitting: *Deleted

## 2020-01-11 DIAGNOSIS — O4442 Low lying placenta NOS or without hemorrhage, second trimester: Secondary | ICD-10-CM

## 2020-01-21 ENCOUNTER — Encounter: Payer: Self-pay | Admitting: Medical

## 2020-01-21 ENCOUNTER — Telehealth (INDEPENDENT_AMBULATORY_CARE_PROVIDER_SITE_OTHER): Payer: Medicaid Other | Admitting: Medical

## 2020-01-21 VITALS — BP 105/58 | HR 85

## 2020-01-21 DIAGNOSIS — O4442 Low lying placenta NOS or without hemorrhage, second trimester: Secondary | ICD-10-CM | POA: Diagnosis not present

## 2020-01-21 DIAGNOSIS — O09299 Supervision of pregnancy with other poor reproductive or obstetric history, unspecified trimester: Secondary | ICD-10-CM

## 2020-01-21 DIAGNOSIS — R8271 Bacteriuria: Secondary | ICD-10-CM

## 2020-01-21 DIAGNOSIS — Z8759 Personal history of other complications of pregnancy, childbirth and the puerperium: Secondary | ICD-10-CM

## 2020-01-21 DIAGNOSIS — Z3A22 22 weeks gestation of pregnancy: Secondary | ICD-10-CM

## 2020-01-21 DIAGNOSIS — Z8619 Personal history of other infectious and parasitic diseases: Secondary | ICD-10-CM

## 2020-01-21 DIAGNOSIS — O99820 Streptococcus B carrier state complicating pregnancy: Secondary | ICD-10-CM

## 2020-01-21 DIAGNOSIS — Z3482 Encounter for supervision of other normal pregnancy, second trimester: Secondary | ICD-10-CM

## 2020-01-21 NOTE — Patient Instructions (Addendum)
Second Trimester of Pregnancy  The second trimester is from week 14 through week 27 (month 4 through 6). This is often the time in pregnancy that you feel your best. Often times, morning sickness has lessened or quit. You may have more energy, and you may get hungry more often. Your unborn baby is growing rapidly. At the end of the sixth month, he or she is about 9 inches long and weighs about 1 pounds. You will likely feel the baby move between 18 and 20 weeks of pregnancy. Follow these instructions at home: Medicines  Take over-the-counter and prescription medicines only as told by your doctor. Some medicines are safe and some medicines are not safe during pregnancy.  Take a prenatal vitamin that contains at least 600 micrograms (mcg) of folic acid.  If you have trouble pooping (constipation), take medicine that will make your stool soft (stool softener) if your doctor approves. Eating and drinking   Eat regular, healthy meals.  Avoid raw meat and uncooked cheese.  If you get low calcium from the food you eat, talk to your doctor about taking a daily calcium supplement.  Avoid foods that are high in fat and sugars, such as fried and sweet foods.  If you feel sick to your stomach (nauseous) or throw up (vomit): ? Eat 4 or 5 small meals a day instead of 3 large meals. ? Try eating a few soda crackers. ? Drink liquids between meals instead of during meals.  To prevent constipation: ? Eat foods that are high in fiber, like fresh fruits and vegetables, whole grains, and beans. ? Drink enough fluids to keep your pee (urine) clear or pale yellow. Activity  Exercise only as told by your doctor. Stop exercising if you start to have cramps.  Do not exercise if it is too hot, too humid, or if you are in a place of great height (high altitude).  Avoid heavy lifting.  Wear low-heeled shoes. Sit and stand up straight.  You can continue to have sex unless your doctor tells you not  to. Relieving pain and discomfort  Wear a good support bra if your breasts are tender.  Take warm water baths (sitz baths) to soothe pain or discomfort caused by hemorrhoids. Use hemorrhoid cream if your doctor approves.  Rest with your legs raised if you have leg cramps or low back pain.  If you develop puffy, bulging veins (varicose veins) in your legs: ? Wear support hose or compression stockings as told by your doctor. ? Raise (elevate) your feet for 15 minutes, 3-4 times a day. ? Limit salt in your food. Prenatal care  Write down your questions. Take them to your prenatal visits.  Keep all your prenatal visits as told by your doctor. This is important. Safety  Wear your seat belt when driving.  Make a list of emergency phone numbers, including numbers for family, friends, the hospital, and police and fire departments. General instructions  Ask your doctor about the right foods to eat or for help finding a counselor, if you need these services.  Ask your doctor about local prenatal classes. Begin classes before month 6 of your pregnancy.  Do not use hot tubs, steam rooms, or saunas.  Do not douche or use tampons or scented sanitary pads.  Do not cross your legs for long periods of time.  Visit your dentist if you have not done so. Use a soft toothbrush to brush your teeth. Floss gently.  Avoid all smoking, herbs,   and alcohol. Avoid drugs that are not approved by your doctor.  Do not use any products that contain nicotine or tobacco, such as cigarettes and e-cigarettes. If you need help quitting, ask your doctor.  Avoid cat litter boxes and soil used by cats. These carry germs that can cause birth defects in the baby and can cause a loss of your baby (miscarriage) or stillbirth. Contact a doctor if:  You have mild cramps or pressure in your lower belly.  You have pain when you pee (urinate).  You have bad smelling fluid coming from your vagina.  You continue to  feel sick to your stomach (nauseous), throw up (vomit), or have watery poop (diarrhea).  You have a nagging pain in your belly area.  You feel dizzy. Get help right away if:  You have a fever.  You are leaking fluid from your vagina.  You have spotting or bleeding from your vagina.  You have severe belly cramping or pain.  You lose or gain weight rapidly.  You have trouble catching your breath and have chest pain.  You notice sudden or extreme puffiness (swelling) of your face, hands, ankles, feet, or legs.  You have not felt the baby move in over an hour.  You have severe headaches that do not go away when you take medicine.  You have trouble seeing. Summary  The second trimester is from week 14 through week 27 (months 4 through 6). This is often the time in pregnancy that you feel your best.  To take care of yourself and your unborn baby, you will need to eat healthy meals, take medicines only if your doctor tells you to do so, and do activities that are safe for you and your baby.  Call your doctor if you get sick or if you notice anything unusual about your pregnancy. Also, call your doctor if you need help with the right food to eat, or if you want to know what activities are safe for you. This information is not intended to replace advice given to you by your health care provider. Make sure you discuss any questions you have with your health care provider. Document Revised: 11/13/2018 Document Reviewed: 08/27/2016 Elsevier Patient Education  2020 [employer]  Safe Medications in Pregnancy   Acne: Benzoyl Peroxide Salicylic Acid  Backache/Headache: Tylenol: 2 regular strength every 4 hours OR              2 Extra strength every 6 hours  Colds/Coughs/Allergies: Benadryl (alcohol free) 25 mg every 6 hours as needed Breath right strips Claritin Cepacol throat lozenges Chloraseptic throat spray Cold-Eeze- up to three times per day Cough drops, alcohol  free Flonase (by prescription only) Guaifenesin Mucinex Robitussin DM (plain only, alcohol free) Saline nasal spray/drops Sudafed (pseudoephedrine) & Actifed ** use only after [redacted] weeks gestation and if you do not have high blood pressure Tylenol Vicks Vaporub Zinc lozenges Zyrtec   Constipation: Colace Ducolax suppositories Fleet enema Glycerin suppositories Metamucil Milk of magnesia Miralax Senokot Smooth move tea  Diarrhea: Kaopectate Imodium A-D  *NO pepto Bismol  Hemorrhoids: Anusol Anusol HC Preparation H Tucks  Indigestion: Tums Maalox Mylanta Zantac  Pepcid  Insomnia: Benadryl (alcohol free) 25mg every 6 hours as needed Tylenol PM Unisom, no Gelcaps  Leg Cramps: Tums MagGel  Nausea/Vomiting:  Bonine Dramamine Emetrol Ginger extract Sea bands Meclizine  Nausea medication to take during pregnancy:  Unisom (doxylamine succinate 25 mg tablets) Take one tablet daily at bedtime. If symptoms are   not adequately controlled, the dose can be increased to a maximum recommended dose of two tablets daily (1/2 tablet in the morning, 1/2 tablet mid-afternoon and one at bedtime). Vitamin B6 100mg tablets. Take one tablet twice a day (up to 200 mg per day).  Skin Rashes: Aveeno products Benadryl cream or 25mg every 6 hours as needed Calamine Lotion 1% cortisone cream  Yeast infection: Gyne-lotrimin 7 Monistat 7   **If taking multiple medications, please check labels to avoid duplicating the same active ingredients **take medication as directed on the label ** Do not exceed 4000 mg of tylenol in 24 hours **Do not take medications that contain aspirin or ibuprofen     

## 2020-01-21 NOTE — Progress Notes (Signed)
I connected with [name] on 01/21/20 at  9:35 AM EDT by: MyChart video and verified that I am speaking with the correct person using two identifiers.  Patient is located at home and provider is located at [facility].     The purpose of this virtual visit is to provide medical care while limiting exposure to the novel coronavirus. I discussed the limitations, risks, security and privacy concerns of performing an evaluation and management service by MyChart video and the availability of in person appointments. I also discussed with the patient that there may be a patient responsible charge related to this service. By engaging in this virtual visit, you consent to the provision of healthcare.  Additionally, you authorize for your insurance to be billed for the services provided during this visit.  The patient expressed understanding and agreed to proceed.  The following staff members participated in the virtual visit: Vonzella Nipple, PA-C    PRENATAL VISIT NOTE  Subjective:  Lyann Hagstrom is a 29 y.o. G3P2002 at [redacted]w[redacted]d  for phone visit for ongoing prenatal care.  She is currently monitored for the following issues for this low-risk pregnancy and has Supervision of normal pregnancy; Fetal growth restriction in prior pregnancy, pregnant; History of herpes genitalis; Group B streptococcal bacteriuria; and Low-lying placenta in second trimester on their problem list.  Patient reports sore throat.  Contractions: Not present. Vag. Bleeding: None.  Movement: Present. Denies leaking of fluid.   The following portions of the patient's history were reviewed and updated as appropriate: allergies, current medications, past family history, past medical history, past social history, past surgical history and problem list.   Objective:   Vitals:   01/21/20 0946  BP: (!) 105/58  Pulse: 85   Self-Obtained  Fetal Status:     Movement: Present     Assessment and Plan:  Pregnancy: G3P2002 at  [redacted]w[redacted]d 1. Encounter for supervision of other normal pregnancy in second trimester - Doing well  2. Group B streptococcal bacteriuria - Treat in labor  3. Fetal growth restriction in prior pregnancy, pregnant - Anatomy US normal, EFW 83% - Serial growth [place] planned per MFM  - Next [place] scheduled 7/7  4. History of herpes genitalis - On valtrex  5. Low-lying placenta in second trimester  6. Sore throat - List of OTC medications safe in pregnancy provided  - Recommended Zyrtec-D or Mucinex-D, throat lozenges or spray and continue Tylenol PRN  - Present to [facility] or UC if symptoms worsen   Preterm labor symptoms and general obstetric precautions including but not limited to vaginal bleeding, contractions, leaking of fluid and fetal movement were reviewed in detail with the patient.  Return in about 4 weeks (around 02/18/2020) for LOB, In-Person, 28 week labs (fasting).  Future Appointments  Date Time Provider Department Center  02/09/2020  9:15 AM [facility] NURSE [facility]  02/09/2020  9:15 AM [facility]2 [facility]    Time spent on virtual visit: 10 minutes  Vonzella Nipple, PA-C

## 2020-02-09 ENCOUNTER — Other Ambulatory Visit: Payer: Self-pay

## 2020-02-09 ENCOUNTER — Other Ambulatory Visit: Payer: Self-pay | Admitting: *Deleted

## 2020-02-09 ENCOUNTER — Ambulatory Visit: Payer: Medicaid Other | Admitting: *Deleted

## 2020-02-09 ENCOUNTER — Ambulatory Visit: Payer: Medicaid Other | Attending: Medical

## 2020-02-09 DIAGNOSIS — O4442 Low lying placenta NOS or without hemorrhage, second trimester: Secondary | ICD-10-CM | POA: Insufficient documentation

## 2020-02-09 DIAGNOSIS — Z363 Encounter for antenatal screening for malformations: Secondary | ICD-10-CM

## 2020-02-09 DIAGNOSIS — Z362 Encounter for other antenatal screening follow-up: Secondary | ICD-10-CM

## 2020-02-09 DIAGNOSIS — Z3A25 25 weeks gestation of pregnancy: Secondary | ICD-10-CM

## 2020-02-09 DIAGNOSIS — O09292 Supervision of pregnancy with other poor reproductive or obstetric history, second trimester: Secondary | ICD-10-CM | POA: Diagnosis not present

## 2020-02-14 ENCOUNTER — Other Ambulatory Visit: Payer: Self-pay | Admitting: General Practice

## 2020-02-14 DIAGNOSIS — Z3482 Encounter for supervision of other normal pregnancy, second trimester: Secondary | ICD-10-CM

## 2020-02-18 ENCOUNTER — Encounter: Payer: Self-pay | Admitting: Medical

## 2020-02-18 ENCOUNTER — Other Ambulatory Visit: Payer: Medicaid Other

## 2020-02-18 ENCOUNTER — Ambulatory Visit (INDEPENDENT_AMBULATORY_CARE_PROVIDER_SITE_OTHER): Payer: Medicaid Other | Admitting: Medical

## 2020-02-18 ENCOUNTER — Other Ambulatory Visit: Payer: Self-pay

## 2020-02-18 VITALS — BP 103/66 | HR 76 | Wt 129.0 lb

## 2020-02-18 DIAGNOSIS — O99820 Streptococcus B carrier state complicating pregnancy: Secondary | ICD-10-CM

## 2020-02-18 DIAGNOSIS — Z3482 Encounter for supervision of other normal pregnancy, second trimester: Secondary | ICD-10-CM | POA: Diagnosis not present

## 2020-02-18 DIAGNOSIS — Z3A26 26 weeks gestation of pregnancy: Secondary | ICD-10-CM

## 2020-02-18 DIAGNOSIS — Z23 Encounter for immunization: Secondary | ICD-10-CM

## 2020-02-18 DIAGNOSIS — R8271 Bacteriuria: Secondary | ICD-10-CM

## 2020-02-18 DIAGNOSIS — Z8619 Personal history of other infectious and parasitic diseases: Secondary | ICD-10-CM

## 2020-02-18 DIAGNOSIS — Z8759 Personal history of other complications of pregnancy, childbirth and the puerperium: Secondary | ICD-10-CM

## 2020-02-18 DIAGNOSIS — O09299 Supervision of pregnancy with other poor reproductive or obstetric history, unspecified trimester: Secondary | ICD-10-CM

## 2020-02-18 DIAGNOSIS — O4442 Low lying placenta NOS or without hemorrhage, second trimester: Secondary | ICD-10-CM

## 2020-02-18 NOTE — Progress Notes (Signed)
   PRENATAL VISIT NOTE  Subjective:  Veronica Shea is a 29 y.o. G3P2002 at [redacted]w[redacted]d being seen today for ongoing prenatal care.  She is currently monitored for the following issues for this high-risk pregnancy and has Supervision of normal pregnancy; Fetal growth restriction in prior pregnancy, pregnant; History of herpes genitalis; Group B streptococcal bacteriuria; and Low-lying placenta in second trimester on their problem list.  Patient reports no complaints.  Contractions: Not present. Vag. Bleeding: None.  Movement: Present. Denies leaking of fluid.   The following portions of the patient's history were reviewed and updated as appropriate: allergies, current medications, past family history, past medical history, past social history, past surgical history and problem list.   Objective:   Vitals:   02/18/20 0903  BP: 103/66  Pulse: 76  Weight: 129 lb (58.5 kg)    Fetal Status: Fetal Heart Rate (bpm): 136 Fundal Height: 26 cm Movement: Present     General:  Alert, oriented and cooperative. Patient is in no acute distress.  Skin: Skin is warm and dry. No rash noted.   Cardiovascular: Normal heart rate noted  Respiratory: Normal respiratory effort, no problems with respiration noted  Abdomen: Soft, gravid, appropriate for gestational age.  Pain/Pressure: Present     Pelvic: Cervical exam deferred        Extremities: Normal range of motion.  Edema: Trace  Mental Status: Normal mood and affect. Normal behavior. Normal judgment and thought content.   Assessment and Plan:  Pregnancy: G3P2002 at [redacted]w[redacted]d 1. Encounter for supervision of other normal pregnancy in second trimester - 2 hour GTT, CBC, HIV, RPR today  - Tdap vaccine greater than or equal to 7yo IM  2. Fetal growth restriction in prior pregnancy, pregnant - Serial growth scans  3. Low-lying placenta in second trimester - [place] 7/7 placenta still low lying, follow-up scheduled 03/22/20  4. Group B streptococcal  bacteriuria - Treat in labor  5. History of herpes genitalis - Ppx at 35 weeks   Preterm labor symptoms and general obstetric precautions including but not limited to vaginal bleeding, contractions, leaking of fluid and fetal movement were reviewed in detail with the patient. Please refer to After Visit Summary for other counseling recommendations.   Return in about 2 weeks (around 03/03/2020) for LOB, In-Person, any provider.  Future Appointments  Date Time Provider Department Center  03/22/2020  8:30 AM [facility] NURSE [facility]  03/22/2020  8:45 AM [facility] US4 [facility]    Vonzella Nipple, PA-C

## 2020-02-18 NOTE — Patient Instructions (Addendum)
https://www.cdc.gov/vaccines/hcp/vis/vis-statements/tdap.pdf">  °Vacuna Tdap (tétanos, difteria y tos ferina): lo que debe saber °Tdap (Tetanus, Diphtheria, Pertussis) Vaccine: What You Need to Know °1. ¿Por qué vacunarse? °La vacuna Tdap puede prevenir el tétanos, la difteria y la tos ferina. °La difteria y la tos ferina se contagian de persona a persona. El tétanos ingresa al organismo a través de cortes o heridas. °· El TÉTANOS (T) provoca rigidez dolorosa en los músculos. El tétanos puede causar graves problemas de salud, como no poder abrir la boca, tener dificultad para tragar y respirar, o la muerte. °· La DIFTERIA (D) puede causar dificultad para respirar, insuficiencia cardíaca, parálisis o muerte. °· La TOS FERINA (aP) también conocida como “tos convulsa” puede causar tos violenta e incontrolable lo que hace difícil respirar, comer o beber. La tos ferina puede ser muy grave en los bebés y en los niños pequeños, y causar neumonía, convulsiones, daño cerebral o la muerte. En adolescentes y adultos, puede causar pérdida de peso, pérdida del control de la vejiga, desmayos y fracturas de costillas al toser de manera intensa. °2. Vacuna Tdap °La vacuna Tdap es solo para niños de 7 años en adelante, adolescentes y adultos.  °Los adolescentes debe recibir una dosis única de la vacuna Tdap, preferentemente a los 11 o 12 años. °Las mujeres embarazadas deben recibir una dosis de la vacuna Tdap en cada embarazo para proteger al recién nacido de la tos ferina. Los bebés tienen mayor riesgo de sufrir complicaciones graves y potencialmente mortales debido a la tos ferina. °Los adultos que nunca recibieron la vacuna Tdap deben recibir una dosis. °Además, los adultos deben recibir una dosis de refuerzo cada 10 años, o antes si la persona sufre una quemadura o una herida grave y sucia. Las dosis de refuerzo pueden ser de la vacuna Tdap o la Td (una vacuna diferente que protege contra el tétanos y la difteria, pero no contra  la tos ferina). °La vacuna Tdap puede ser administrada al mismo tiempo que otras vacunas. °3. Hable con el médico °Comuníquese con la persona que le coloca las vacunas si la persona que la recibe: °· Ha tenido una reacción alérgica después de una dosis anterior de cualquier vacuna contra el tétanos, la difteria o la tos ferina, o cualquier alergia grave, potencialmente mortal. °· Ha tenido un coma, disminución del nivel de la conciencia o convulsiones prolongadas dentro de los 7 días posteriores a una dosis anterior de cualquier vacuna contra la tos ferina (DTP, DTaP o Tdap). °· Tiene convulsiones u otro problema del sistema nervioso. °· Alguna vez tuvo síndrome de Guillain-Barré (también llamado SGB). °· Ha tenido dolor intenso o hinchazón después de una dosis anterior de cualquier vacuna contra el tétanos o la difteria. °En algunos casos, es posible que el médico decida posponer la aplicación de la vacuna Tdap para una visita en el futuro.  °Las personas que sufren trastornos menores, como un resfrío, pueden vacunarse. Las personas que tienen enfermedades moderadas o graves generalmente deben esperar hasta recuperarse para poder recibir la vacuna Tdap.  °Su médico puede darle más información. °4. Riesgos de una reacción a la vacuna °· Después de recibir la vacuna Tdap a veces se puede tener dolor, enrojecimiento o hinchazón en el lugar donde se aplicó la inyección, fiebre leve, dolor de cabeza, sensación de cansancio y náuseas, vómitos, diarrea o dolor de estómago. °Las personas a veces se desmayan después de procedimientos médicos, incluida la vacunación. Informe al médico si se siente mareado, tiene cambios en la visión o zumbidos   en los odos.  Al igual que con cualquier Automatic Data, existe una probabilidad muy remota de que una vacuna cause una reaccin alrgica grave, otra lesin grave o la muerte. 5. Qu pasa si se presenta un problema grave? Podra producirse una reaccin alrgica despus de que la  persona vacunada abandone la clnica. Si observa signos de Runner, broadcasting/film/video grave (ronchas, hinchazn de la cara y la garganta, dificultad para respirar, latidos cardacos acelerados, mareos o debilidad), llame al 9-1-1 y lleve a la persona al hospital ms cercano. Si se presentan otros signos que le preocupan, comunquese con su mdico.  Las reacciones adversas deben informarse al Sistema de Informe de Eventos Adversos de Administrator, arts (Vaccine Adverse Event Reporting System, VAERS). Por lo general, el mdico presenta este informe o puede hacerlo usted mismo. Visite el sitio web del VAERS en www.vaers.LAgents.no o llame al (220)765-9183. El VAERS es solo para Biomedical engineer; su personal no proporciona asesoramiento mdico. 6. Programa Nacional de Compensacin de Daos por [employer] El [employer] de Compensacin de Daos por Administrator, arts (National Vaccine Injury [employer], Cabin crew) es un programa federal que fue creado para Patent examiner a las personas que puedan haber sufrido daos al recibir ciertas vacunas. Visite el sitio web del VICP en SpiritualWord.at o llame al 1-3365558363 para obtener ms informacin acerca del programa y de cmo presentar un reclamo. Hay un lmite de tiempo para presentar un reclamo de compensacin. 7. Cmo puedo obtener ms informacin?  Pregntele a su mdico.  Comunquese con el servicio de salud de su localidad o su estado.  Comunquese con Building control surveyor for [employer] and Prevention, CDC (Centros para el Control y la Prevencin de [place]): ? Llame al 234-264-2670 (1-800-CDC-INFO) o ? Visite el sitio Environmental manager en PicCapture.uy Declaracin de informacin de la vacuna Tdap (ttanos, difteria y Kalman Shan) (08/08/2018) Esta informacin no tiene Theme park manager el consejo del mdico. Asegrese de hacerle al mdico cualquier pregunta que tenga. Document Revised: 12/01/2018 Document Reviewed: 12/01/2018 Elsevier Patient  Education  2020 Elsevier Inc.  [employer] of the uterus can occur throughout pregnancy, but they are not always a sign that you are in labor. You may have practice contractions called Braxton Hicks contractions. These false labor contractions are sometimes confused with true labor. What are Deberah Pelton contractions? Braxton Hicks contractions are tightening movements that occur in the muscles of the uterus before labor. Unlike true labor contractions, these contractions do not result in opening (dilation) and thinning of the cervix. Toward the end of pregnancy (32-34 weeks), Braxton Hicks contractions can happen more often and may become stronger. These contractions are sometimes difficult to tell apart from true labor because they can be very uncomfortable. You should not feel embarrassed if you go to the hospital with false labor. Sometimes, the only way to tell if you are in true labor is for your health care provider to look for changes in the cervix. The health care provider will do a physical exam and may monitor your contractions. If you are not in true labor, the exam should show that your cervix is not dilating and your water has not broken. If there are no other health problems associated with your pregnancy, it is completely safe for you to be sent home with false labor. You may continue to have Braxton Hicks contractions until you go into true labor. How to tell the difference between true labor and false labor True labor  Contractions last 30-70 seconds.  Contractions  become very regular.  Discomfort is usually felt in the top of the uterus, and it spreads to the lower abdomen and low back.  Contractions do not go away with walking.  Contractions usually become more intense and increase in frequency.  The cervix dilates and gets thinner. False labor  Contractions are usually shorter and not as strong as true labor contractions.  Contractions are  usually irregular.  Contractions are often felt in the front of the lower abdomen and in the groin.  Contractions may go away when you walk around or change positions while lying down.  Contractions get weaker and are shorter-lasting as time goes on.  The cervix usually does not dilate or become thin. Follow these instructions at home:   Take over-the-counter and prescription medicines only as told by your health care provider.  Keep up with your usual exercises and follow other instructions from your health care provider.  Eat and drink lightly if you think you are going into labor.  If Braxton Hicks contractions are making you uncomfortable: ? Change your position from lying down or resting to walking, or change from walking to resting. ? Sit and rest in a tub of warm water. ? Drink enough fluid to keep your urine pale yellow. Dehydration may cause these contractions. ? Do slow and deep breathing several times an hour.  Keep all follow-up prenatal visits as told by your health care provider. This is important. Contact a health care provider if:  You have a fever.  You have continuous pain in your abdomen. Get help right away if:  Your contractions become stronger, more regular, and closer together.  You have fluid leaking or gushing from your vagina.  You pass blood-tinged mucus (bloody show).  You have bleeding from your vagina.  You have low back pain that you never had before.  You feel your babys head pushing down and causing pelvic pressure.  Your baby is not moving inside you as much as it used to. Summary  Contractions that occur before labor are called Braxton Hicks contractions, false labor, or practice contractions.  Braxton Hicks contractions are usually shorter, weaker, farther apart, and less regular than true labor contractions. True labor contractions usually become progressively stronger and regular, and they become more frequent.  Manage  discomfort from [facility] contractions by changing position, resting in a warm bath, drinking plenty of water, or practicing deep breathing. This information is not intended to replace advice given to you by your health care provider. Make sure you discuss any questions you have with your health care provider. Document Revised: 07/04/2017 Document Reviewed: 12/05/2016 Elsevier Patient Education  2020 Elsevier Inc.   Fetal Movement Counts Patient Name: ________________________________________________ Patient Due Date: ____________________ What is a fetal movement count?  A fetal movement count is the number of times that you feel your baby move during a certain amount of time. This may also be called a fetal kick count. A fetal movement count is recommended for every pregnant woman. You may be asked to start counting fetal movements as early as week 28 of your pregnancy. Pay attention to when your baby is most active. You may notice your baby's sleep and wake cycles. You may also notice things that make your baby move more. You should do a fetal movement count:  When your baby is normally most active.  At the same time each day. A good time to count movements is while you are resting, after having something to eat  and drink. How do I count fetal movements? 1. Find a quiet, comfortable area. Sit, or lie down on your side. 2. Write down the date, the start time and stop time, and the number of movements that you felt between those two times. Take this information with you to your health care visits. 3. Write down your start time when you feel the first movement. 4. Count kicks, flutters, swishes, rolls, and jabs. You should feel at least 10 movements. 5. You may stop counting after you have felt 10 movements, or if you have been counting for 2 hours. Write down the stop time. 6. If you do not feel 10 movements in 2 hours, contact your health care provider for further instructions. Your  health care provider may want to do additional tests to assess your baby's well-being. Contact a health care provider if:  You feel fewer than 10 movements in 2 hours.  Your baby is not moving like he or she usually does. Date: ____________ Start time: ____________ Stop time: ____________ Movements: ____________ Date: ____________ Start time: ____________ Stop time: ____________ Movements: ____________ Date: ____________ Start time: ____________ Stop time: ____________ Movements: ____________ Date: ____________ Start time: ____________ Stop time: ____________ Movements: ____________ Date: ____________ Start time: ____________ Stop time: ____________ Movements: ____________ Date: ____________ Start time: ____________ Stop time: ____________ Movements: ____________ Date: ____________ Start time: ____________ Stop time: ____________ Movements: ____________ Date: ____________ Start time: ____________ Stop time: ____________ Movements: ____________ Date: ____________ Start time: ____________ Stop time: ____________ Movements: ____________ This information is not intended to replace advice given to you by your health care provider. Make sure you discuss any questions you have with your health care provider. Document Revised: 03/11/2019 Document Reviewed: 03/11/2019 Elsevier Patient Education  2020 [employer].

## 2020-02-19 LAB — GLUCOSE TOLERANCE, 2 HOURS W/ 1HR
Glucose, 1 hour: 132 mg/dL (ref 65–179)
Glucose, 2 hour: 84 mg/dL (ref 65–152)
Glucose, Fasting: 73 mg/dL (ref 65–91)

## 2020-02-19 LAB — HIV ANTIBODY (ROUTINE TESTING W REFLEX): HIV Screen 4th Generation wRfx: NONREACTIVE

## 2020-02-19 LAB — CBC
Hematocrit: 37.3 % (ref 34.0–46.6)
Hemoglobin: 12.2 g/dL (ref 11.1–15.9)
MCH: 29.8 pg (ref 26.6–33.0)
MCHC: 32.7 g/dL (ref 31.5–35.7)
MCV: 91 fL (ref 79–97)
Platelets: 274 10*3/uL (ref 150–450)
RBC: 4.09 x10E6/uL (ref 3.77–5.28)
RDW: 14 % (ref 11.7–15.4)
WBC: 9.8 10*3/uL (ref 3.4–10.8)

## 2020-02-19 LAB — RPR: RPR Ser Ql: NONREACTIVE

## 2020-03-03 ENCOUNTER — Other Ambulatory Visit: Payer: Self-pay | Admitting: Medical

## 2020-03-03 DIAGNOSIS — Z8619 Personal history of other infectious and parasitic diseases: Secondary | ICD-10-CM

## 2020-03-03 MED ORDER — VALACYCLOVIR HCL 1 G PO TABS: 500.0000 mg | ORAL_TABLET | Freq: Two times a day (BID) | ORAL | 1 refills | Status: DC

## 2020-03-06 ENCOUNTER — Other Ambulatory Visit: Payer: Self-pay

## 2020-03-06 ENCOUNTER — Ambulatory Visit (INDEPENDENT_AMBULATORY_CARE_PROVIDER_SITE_OTHER): Payer: Medicaid Other | Admitting: Student

## 2020-03-06 DIAGNOSIS — Z3403 Encounter for supervision of normal first pregnancy, third trimester: Secondary | ICD-10-CM

## 2020-03-06 NOTE — Progress Notes (Signed)
   PRENATAL VISIT NOTE  Subjective:  Veronica Shea is a 29 y.o. G3P2002 at [redacted]w[redacted]d being seen today for ongoing prenatal care.  She is currently monitored for the following issues for this low-risk pregnancy and has Supervision of normal pregnancy; Fetal growth restriction in prior pregnancy, pregnant; History of herpes genitalis; Group B streptococcal bacteriuria; and Low-lying placenta in second trimester on their problem list.  Patient reports no complaints.  Contractions: Not present. Vag. Bleeding: None.  Movement: Present. Denies leaking of fluid.   The following portions of the patient's history were reviewed and updated as appropriate: allergies, current medications, past family history, past medical history, past social history, past surgical history and problem list.   Objective:   Vitals:   03/06/20 0821  BP: (!) 98/61  Pulse: 80  Weight: 133 lb 6.4 oz (60.5 kg)    Fetal Status: Fetal Heart Rate (bpm): 140 Fundal Height: 28 cm Movement: Present     General:  Alert, oriented and cooperative. Patient is in no acute distress.  Skin: Skin is warm and dry. No rash noted.   Cardiovascular: Normal heart rate noted  Respiratory: Normal respiratory effort, no problems with respiration noted  Abdomen: Soft, gravid, appropriate for gestational age.  Pain/Pressure: Present     Pelvic: Cervical exam deferred        Extremities: Normal range of motion.  Edema: Trace  Mental Status: Normal mood and affect. Normal behavior. Normal judgment and thought content.   Assessment and Plan:  Pregnancy: G3P2002 at [redacted]w[redacted]d 1. Encounter for supervision of normal first pregnancy in third trimester -passed two hours  -keep appt for [place] next week -signed BTL papers -Start checking BP weekly Reviewed warning blood pressure values (systolic = / > 140 and/or diastolic =/> 90). Explained that, if blood pressure is elevated, she should sit down, rest, and eat/drink something. If still elevated 15  minutes later, and she is greater than 20 weeks, she should call clinic or come to [facility]U. She should come to [facility]U if she has elevated pressures and any of the following:  - headache not relieved with tylenol, rest, hydration -blurry vision, floating spots in her vision - sudden full-body edema or facial edema -RUQ pain that is constant.  These symptoms may indicate that her blood pressure is worsening and she may be developing gestational hypertension or pre-eclampsia, which is an emergency.     Preterm labor symptoms and general obstetric precautions including but not limited to vaginal bleeding, contractions, leaking of fluid and fetal movement were reviewed in detail with the patient. Please refer to After Visit Summary for other counseling recommendations.   No follow-ups on file.  Future Appointments  Date Time Provider Department Center  03/22/2020  8:30 AM [facility]URSE [facility]  03/22/2020  8:45 AM [facility] US4 [facility]    Marylene Land, CNM

## 2020-03-08 ENCOUNTER — Encounter: Payer: Self-pay | Admitting: *Deleted

## 2020-03-21 ENCOUNTER — Telehealth (INDEPENDENT_AMBULATORY_CARE_PROVIDER_SITE_OTHER): Payer: Medicaid Other | Admitting: Obstetrics and Gynecology

## 2020-03-21 ENCOUNTER — Other Ambulatory Visit: Payer: Self-pay

## 2020-03-21 VITALS — BP 112/66 | HR 82

## 2020-03-21 DIAGNOSIS — R8271 Bacteriuria: Secondary | ICD-10-CM

## 2020-03-21 DIAGNOSIS — Z8619 Personal history of other infectious and parasitic diseases: Secondary | ICD-10-CM

## 2020-03-21 DIAGNOSIS — O4442 Low lying placenta NOS or without hemorrhage, second trimester: Secondary | ICD-10-CM

## 2020-03-21 DIAGNOSIS — O99820 Streptococcus B carrier state complicating pregnancy: Secondary | ICD-10-CM

## 2020-03-21 DIAGNOSIS — O4443 Low lying placenta NOS or without hemorrhage, third trimester: Secondary | ICD-10-CM

## 2020-03-21 DIAGNOSIS — Z3483 Encounter for supervision of other normal pregnancy, third trimester: Secondary | ICD-10-CM

## 2020-03-21 DIAGNOSIS — Z3A31 31 weeks gestation of pregnancy: Secondary | ICD-10-CM

## 2020-03-21 DIAGNOSIS — O09293 Supervision of pregnancy with other poor reproductive or obstetric history, third trimester: Secondary | ICD-10-CM

## 2020-03-21 DIAGNOSIS — O09299 Supervision of pregnancy with other poor reproductive or obstetric history, unspecified trimester: Secondary | ICD-10-CM

## 2020-03-21 NOTE — Progress Notes (Signed)
   OBSTETRICS PRENATAL VIRTUAL VISIT ENCOUNTER NOTE  Provider location: Center for [facility]   I connected with Veronica Shea on 03/21/20 at  1:35 PM EDT by MyChart Video Encounter at home and verified that I am speaking with the correct person using two identifiers.   I discussed the limitations, risks, security and privacy concerns of performing an evaluation and management service virtually and the availability of in person appointments. I also discussed with the patient that there may be a patient responsible charge related to this service. The patient expressed understanding and agreed to proceed. Subjective:  Veronica Shea is a 29 y.o. G3P2002 at [redacted]w[redacted]d being seen today for ongoing prenatal care.  She is currently monitored for the following issues for this low-risk pregnancy and has Supervision of normal pregnancy; Fetal growth restriction in prior pregnancy, pregnant; History of herpes genitalis; Group B streptococcal bacteriuria; and Low-lying placenta in second trimester on their problem list.  Patient reports abdominal pain worse with changing position in bed.  Contractions: Not present. Vag. Bleeding: None.  Movement: Present. Denies any leaking of fluid.   The following portions of the patient's history were reviewed and updated as appropriate: allergies, current medications, past family history, past medical history, past social history, past surgical history and problem list.   Objective:   Vitals:   03/21/20 1334  BP: 112/66  Pulse: 82    Fetal Status:     Movement: Present     General:  Alert, oriented and cooperative. Patient is in no acute distress.  Respiratory: Normal respiratory effort, no problems with respiration noted  Mental Status: Normal mood and affect. Normal behavior. Normal judgment and thought content.  Rest of physical exam deferred due to type of encounter  Imaging: No results found.  Assessment and  Plan:  Pregnancy: G3P2002 at [redacted]w[redacted]d 1. Group B streptococcal bacteriuria Treat in labor  2. History of herpes genitalis Prophylaxis at 35-36 weeks  3. Encounter for supervision of other normal pregnancy in third trimester   4. Fetal growth restriction in prior pregnancy, pregnant U/s on 03/22/2020  5. Low-lying placenta in second trimester Pt has f/u scan on 03/22/2020 for placenta placement  Preterm labor symptoms and general obstetric precautions including but not limited to vaginal bleeding, contractions, leaking of fluid and fetal movement were reviewed in detail with the patient. I discussed the assessment and treatment plan with the patient. The patient was provided an opportunity to ask questions and all were answered. The patient agreed with the plan and demonstrated an understanding of the instructions. The patient was advised to call back or seek an in-person office evaluation/go to MAU at [facility] for any urgent or concerning symptoms. Please refer to After Visit Summary for other counseling recommendations.   I provided 12 minutes of face-to-face time during this encounter.  Return in about 2 weeks (around 04/04/2020) for ROB, in person.  Future Appointments  Date Time Provider Department Center  03/22/2020  8:30 AM [facility] NURSE [facility] [place]  03/22/2020  8:45 AM [facility] US4 [facility]    Warden Fillers, MD Center for [employer], [facility]

## 2020-03-21 NOTE — Patient Instructions (Signed)

## 2020-03-22 ENCOUNTER — Other Ambulatory Visit: Payer: Self-pay | Admitting: Maternal & Fetal Medicine

## 2020-03-22 ENCOUNTER — Ambulatory Visit: Payer: Medicaid Other

## 2020-03-22 ENCOUNTER — Other Ambulatory Visit: Payer: Self-pay

## 2020-03-22 ENCOUNTER — Other Ambulatory Visit: Payer: Self-pay | Admitting: *Deleted

## 2020-03-22 ENCOUNTER — Ambulatory Visit: Payer: Medicaid Other | Attending: Obstetrics and Gynecology

## 2020-03-22 DIAGNOSIS — Z362 Encounter for other antenatal screening follow-up: Secondary | ICD-10-CM | POA: Diagnosis not present

## 2020-03-22 DIAGNOSIS — O4440 Low lying placenta NOS or without hemorrhage, unspecified trimester: Secondary | ICD-10-CM

## 2020-03-22 DIAGNOSIS — Z3A31 31 weeks gestation of pregnancy: Secondary | ICD-10-CM | POA: Diagnosis not present

## 2020-03-22 DIAGNOSIS — O4443 Low lying placenta NOS or without hemorrhage, third trimester: Secondary | ICD-10-CM | POA: Diagnosis not present

## 2020-03-22 DIAGNOSIS — O09293 Supervision of pregnancy with other poor reproductive or obstetric history, third trimester: Secondary | ICD-10-CM

## 2020-04-06 ENCOUNTER — Other Ambulatory Visit: Payer: Self-pay

## 2020-04-06 ENCOUNTER — Ambulatory Visit (INDEPENDENT_AMBULATORY_CARE_PROVIDER_SITE_OTHER): Payer: Medicaid Other | Admitting: Women's Health

## 2020-04-06 VITALS — BP 112/67 | HR 71 | Wt 138.1 lb

## 2020-04-06 DIAGNOSIS — O09299 Supervision of pregnancy with other poor reproductive or obstetric history, unspecified trimester: Secondary | ICD-10-CM

## 2020-04-06 DIAGNOSIS — Z3483 Encounter for supervision of other normal pregnancy, third trimester: Secondary | ICD-10-CM

## 2020-04-06 DIAGNOSIS — Z8619 Personal history of other infectious and parasitic diseases: Secondary | ICD-10-CM

## 2020-04-06 DIAGNOSIS — R8271 Bacteriuria: Secondary | ICD-10-CM

## 2020-04-06 DIAGNOSIS — O4442 Low lying placenta NOS or without hemorrhage, second trimester: Secondary | ICD-10-CM

## 2020-04-06 NOTE — Patient Instructions (Signed)
Maternity Assessment Unit (MAU)  The Maternity Assessment Unit (MAU) is located at the [facility] [place] [facility]. The address is: [address] [place], [place], [place] [address]. Please see map below for additional directions.    The Maternity Assessment Unit is designed to help you during your pregnancy, and for up to 6 weeks after delivery, with any pregnancy- or postpartum-related emergencies, if you think you are in labor, or if your water has broken. For example, if you experience nausea and vomiting, vaginal bleeding, severe abdominal or pelvic pain, elevated blood pressure or other problems related to your pregnancy or postpartum time, please come to the Maternity Assessment Unit for assistance.        Preterm Labor and Birth Information  The normal length of a pregnancy is 39-41 weeks. Preterm labor is when labor starts before 37 completed weeks of pregnancy. What are the risk factors for preterm labor? Preterm labor is more likely to occur in women who:  Have certain infections during pregnancy such as a bladder infection, sexually transmitted infection, or infection inside the uterus (chorioamnionitis).  Have a shorter-than-normal cervix.  Have gone into preterm labor before.  Have had surgery on their cervix.  Are younger than age 64 or older than age 26.  Are African American.  Are pregnant with twins or multiple babies (multiple gestation).  Take street drugs or smoke while pregnant.  Do not gain enough weight while pregnant.  Became pregnant shortly after having been pregnant. What are the symptoms of preterm labor? Symptoms of preterm labor include:  Cramps similar to those that can happen during a menstrual period. The cramps may happen with diarrhea.  Pain in the abdomen or lower back.  Regular uterine contractions that may feel like tightening of the abdomen.  A feeling of increased pressure in the  pelvis.  Increased watery or bloody mucus discharge from the vagina.  Water breaking (ruptured amniotic sac). Why is it important to recognize signs of preterm labor? It is important to recognize signs of preterm labor because babies who are born prematurely may not be fully developed. This can put them at an increased risk for:  Long-term (chronic) heart and lung problems.  Difficulty immediately after birth with regulating body systems, including blood sugar, body temperature, heart rate, and breathing rate.  Bleeding in the brain.  Cerebral palsy.  Learning difficulties.  Death. These risks are highest for babies who are born before 38 weeks of pregnancy. How is preterm labor treated? Treatment depends on the length of your pregnancy, your condition, and the health of your baby. It may involve:  Having a stitch (suture) placed in your cervix to prevent your cervix from opening too early (cerclage).  Taking or being given medicines, such as: ? Hormone medicines. These may be given early in pregnancy to help support the pregnancy. ? Medicine to stop contractions. ? Medicines to help mature the baby's lungs. These may be prescribed if the risk of delivery is high. ? Medicines to prevent your baby from developing cerebral palsy. If the labor happens before 34 weeks of pregnancy, you may need to stay in the hospital. What should I do if I think I am in preterm labor? If you think that you are going into preterm labor, call your health care provider right away. How can I prevent preterm labor in future pregnancies? To increase your chance of having a full-term pregnancy:  Do not use any tobacco products, such as  cigarettes, chewing tobacco, and e-cigarettes. If you need help quitting, ask your health care provider.  Do not use street drugs or medicines that have not been prescribed to you during your pregnancy.  Talk with your health care provider before taking any herbal  supplements, even if you have been taking them regularly.  Make sure you gain a healthy amount of weight during your pregnancy.  Watch for infection. If you think that you might have an infection, get it checked right away.  Make sure to tell your health care provider if you have gone into preterm labor before. This information is not intended to replace advice given to you by your health care provider. Make sure you discuss any questions you have with your health care provider. Document Revised: 11/13/2018 Document Reviewed: 12/13/2015 Elsevier Patient Education  2020 Elsevier Inc.        Group B Streptococcus Infection During Pregnancy Group B Streptococcus (GBS) is a type of bacteria that is often found in healthy people. It is commonly found in the rectum, vagina, and intestines. In people who are healthy and not pregnant, the bacteria rarely cause serious illness or complications. However, women who test positive for GBS during pregnancy can pass the bacteria to the baby during childbirth. This can cause serious infection in the baby after birth. Women with GBS may also have infections during their pregnancy or soon after childbirth. The infections include urinary tract infections (UTIs) or infections of the uterus. GBS also increases a woman's risk of complications during pregnancy, such as early labor or delivery, miscarriage, or stillbirth. Routine testing for GBS is recommended for all pregnant women. What are the causes? This condition is caused by bacteria called Streptococcus agalactiae. What increases the risk? You may have a higher risk for GBS infection during pregnancy if you had one during a past pregnancy. What are the signs or symptoms? In most cases, GBS infection does not cause symptoms in pregnant women. If symptoms exist, they may include:  Labor that starts before the 37th week of pregnancy.  A UTI or bladder infection. This may cause a fever, frequent urination,  or pain and burning during urination.  Fever during labor. There can also be a rapid heartbeat in the mother or baby. Rare but serious symptoms of a GBS infection in women include:  Blood infection (septicemia). This may cause fever, chills, or confusion.  Lung infection (pneumonia). This may cause fever, chills, cough, rapid breathing, chest pain, or difficulty breathing.  Bone, joint, skin, or soft tissue infection. How is this diagnosed? You may be screened for GBS between week 35 and week 37 of pregnancy. If you have symptoms of preterm labor, you may be screened earlier. This condition is diagnosed based on lab test results from:  A swab of fluid from the vagina and rectum.  A urine sample. How is this treated? This condition is treated with antibiotic medicine. Antibiotic medicine may be given:  To you when you go into labor, or as soon as your water breaks. The medicines will continue until after you give birth. If you are having a cesarean delivery, you do not need antibiotics unless your water has broken.  To your baby, if he or she requires treatment. Your health care provider will check your baby to decide if he or she needs antibiotics to prevent a serious infection. Follow these instructions at home:  Take over-the-counter and prescription medicines only as told by your health care provider.  Take your antibiotic  medicine as told by your health care provider. Do not stop taking the antibiotic even if you start to feel better.  Keep all pre-birth (prenatal) visits and follow-up visits as told by your health care provider. This is important. Contact a health care provider if:  You have pain or burning when you urinate.  You have to urinate more often than usual.  You have a fever or chills.  You develop a bad-smelling vaginal discharge. Get help right away if:  Your water breaks.  You go into labor.  You have severe pain in your abdomen.  You have difficulty  breathing.  You have chest pain. These symptoms may represent a serious problem that is an emergency. Do not wait to see if the symptoms will go away. Get medical help right away. Call your local emergency services (911 in the [place] Do not drive yourself to the hospital. Summary  GBS is a type of bacteria that is common in healthy people.  During pregnancy, colonization with GBS can cause serious complications for you or your baby.  Your health care provider will screen you between 35 and 37 weeks of pregnancy to determine if you are colonized with GBS.  If you are colonized with GBS during pregnancy, your health care provider will recommend antibiotics through an IV during labor.  After delivery, your baby will be evaluated for complications related to potential GBS infection and may require antibiotics to prevent a serious infection. This information is not intended to replace advice given to you by your health care provider. Make sure you discuss any questions you have with your health care provider. Document Revised: 02/15/2019 Document Reviewed: 02/15/2019 Elsevier Patient Education  2020 [employer].

## 2020-04-06 NOTE — Progress Notes (Signed)
Subjective:  Veronica Shea is a 29 y.o. G3P2002 at [redacted]w[redacted]d being seen today for ongoing prenatal care.  She is currently monitored for the following issues for this low-risk pregnancy and has Supervision of normal pregnancy; Fetal growth restriction in prior pregnancy, pregnant; History of herpes genitalis; Group B streptococcal bacteriuria; and Low-lying placenta in second trimester on their problem list.  Patient reports no complaints.  Contractions: Not present. Vag. Bleeding: None.  Movement: Present. Denies leaking of fluid.   The following portions of the patient's history were reviewed and updated as appropriate: allergies, current medications, past family history, past medical history, past social history, past surgical history and problem list. Problem list updated.  Objective:   Vitals:   04/06/20 1540  BP: 112/67  Pulse: 71  Weight: 138 lb 1.6 oz (62.6 kg)    Fetal Status: Fetal Heart Rate (bpm): 140 Fundal Height: 31 cm Movement: Present     General:  Alert, oriented and cooperative. Patient is in no acute distress.  Skin: Skin is warm and dry. No rash noted.   Cardiovascular: Normal heart rate noted  Respiratory: Normal respiratory effort, no problems with respiration noted  Abdomen: Soft, gravid, appropriate for gestational age. Pain/Pressure: Present     Pelvic: Vag. Bleeding: None     Cervical exam deferred        Extremities: Normal range of motion.  Edema: Trace  Mental Status: Normal mood and affect. Normal behavior. Normal judgment and thought content.   Urinalysis:      Assessment and Plan:  Pregnancy: G3P2002 at [redacted]w[redacted]d  1. Encounter for supervision of other normal pregnancy in third trimester  2. Fetal growth restriction in prior pregnancy, pregnant -EFW 74% at 31 weeks -growth scan scheduled 04/19/2020  3. Low-lying placenta in second trimester -persisted at 31 weeks -no vaginal bleeding -f/u US scheduled 04/19/2020  4. Group B streptococcal  bacteriuria -treat in labor  5. History of herpes genitalis -has suppressive therapy at home from RX 02/2020, start 35/36 weeks -patient reports she is currently on suppressive therapy, but reports she was told to start taking it daily at the time of the prescription -pt advised she can continue to do this or stop and restart in two weeks, pt elects to continue RX   Preterm labor symptoms and general obstetric precautions including but not limited to vaginal bleeding, contractions, leaking of fluid and fetal movement were reviewed in detail with the patient. I discussed the assessment and treatment plan with the patient. The patient was provided an opportunity to ask questions and all were answered. The patient agreed with the plan and demonstrated an understanding of the instructions. The patient was advised to call back or seek an in-person office evaluation/go to MAU at [facility] for any urgent or concerning symptoms. Please refer to After Visit Summary for other counseling recommendations.  Return in about 3 weeks (around 04/27/2020) for in-person LOB/APP OK/cultures.   Takuma Cifelli, Odie Sera, NP

## 2020-04-19 ENCOUNTER — Other Ambulatory Visit: Payer: Self-pay | Admitting: Obstetrics and Gynecology

## 2020-04-19 ENCOUNTER — Other Ambulatory Visit: Payer: Self-pay

## 2020-04-19 ENCOUNTER — Ambulatory Visit: Payer: Medicaid Other | Attending: Obstetrics and Gynecology

## 2020-04-19 ENCOUNTER — Ambulatory Visit: Payer: Medicaid Other

## 2020-04-19 DIAGNOSIS — O4440 Low lying placenta NOS or without hemorrhage, unspecified trimester: Secondary | ICD-10-CM

## 2020-04-19 DIAGNOSIS — Z362 Encounter for other antenatal screening follow-up: Secondary | ICD-10-CM

## 2020-04-19 DIAGNOSIS — O09293 Supervision of pregnancy with other poor reproductive or obstetric history, third trimester: Secondary | ICD-10-CM | POA: Diagnosis not present

## 2020-04-19 DIAGNOSIS — O4443 Low lying placenta NOS or without hemorrhage, third trimester: Secondary | ICD-10-CM

## 2020-04-19 DIAGNOSIS — Z3A35 35 weeks gestation of pregnancy: Secondary | ICD-10-CM

## 2020-04-26 ENCOUNTER — Other Ambulatory Visit: Payer: Self-pay

## 2020-04-26 ENCOUNTER — Encounter: Payer: Self-pay | Admitting: Advanced Practice Midwife

## 2020-04-26 ENCOUNTER — Ambulatory Visit (INDEPENDENT_AMBULATORY_CARE_PROVIDER_SITE_OTHER): Payer: Medicaid Other | Admitting: Advanced Practice Midwife

## 2020-04-26 ENCOUNTER — Other Ambulatory Visit (HOSPITAL_COMMUNITY)
Admission: RE | Admit: 2020-04-26 | Discharge: 2020-04-26 | Disposition: A | Discharge status: Home / Self Care | Payer: Medicaid Other | Source: Ambulatory Visit | Attending: Advanced Practice Midwife | Admitting: Advanced Practice Midwife

## 2020-04-26 VITALS — BP 100/69 | HR 72 | Wt 143.7 lb

## 2020-04-26 DIAGNOSIS — O4442 Low lying placenta NOS or without hemorrhage, second trimester: Secondary | ICD-10-CM

## 2020-04-26 DIAGNOSIS — Z3A36 36 weeks gestation of pregnancy: Secondary | ICD-10-CM | POA: Insufficient documentation

## 2020-04-26 DIAGNOSIS — Z01419 Encounter for gynecological examination (general) (routine) without abnormal findings: Secondary | ICD-10-CM | POA: Insufficient documentation

## 2020-04-26 DIAGNOSIS — Z3483 Encounter for supervision of other normal pregnancy, third trimester: Secondary | ICD-10-CM

## 2020-04-26 DIAGNOSIS — Z3493 Encounter for supervision of normal pregnancy, unspecified, third trimester: Secondary | ICD-10-CM | POA: Diagnosis not present

## 2020-04-26 DIAGNOSIS — L299 Pruritus, unspecified: Secondary | ICD-10-CM

## 2020-04-26 NOTE — Progress Notes (Signed)
   PRENATAL VISIT NOTE  Subjective:  Veronica Shea is a 29 y.o. G3P2002 at [redacted]w[redacted]d being seen today for ongoing prenatal care.  She is currently monitored for the following issues for this low-risk pregnancy and has Supervision of normal pregnancy; Fetal growth restriction in prior pregnancy, pregnant; History of herpes genitalis; Group B streptococcal bacteriuria; and Low-lying placenta in second trimester on their problem list.  Patient reports itching of hands and feet. .  Contractions: Not present. Vag. Bleeding: None.  Movement: Present. Denies leaking of fluid.   The following portions of the patient's history were reviewed and updated as appropriate: allergies, current medications, past family history, past medical history, past social history, past surgical history and problem list.   Objective:   Vitals:   04/26/20 0944  BP: 100/69  Pulse: 72  Weight: 143 lb 11.2 oz (65.2 kg)    Fetal Status: Fetal Heart Rate (bpm): 143 Fundal Height: 36 cm Movement: Present     General:  Alert, oriented and cooperative. Patient is in no acute distress.  Skin: Skin is warm and dry. No rash noted.   Cardiovascular: Normal heart rate noted  Respiratory: Normal respiratory effort, no problems with respiration noted  Abdomen: Soft, gravid, appropriate for gestational age.  Pain/Pressure: Present     Pelvic: Cervical exam performed in the presence of a chaperone Dilation: 1 Effacement (%): Thick Station: Ballotable  Extremities: Normal range of motion.  Edema: Trace  Mental Status: Normal mood and affect. Normal behavior. Normal judgment and thought content.   Assessment and Plan:  Pregnancy: G3P2002 at [redacted]w[redacted]d 1. Low-lying placenta in second trimester - Resolved on [place] with MFM on 04/19/2020, they said ok to attempt NSVD.   2. Encounter for supervision of other normal pregnancy in third trimester  3. [redacted] weeks gestation of pregnancy - GBS+ in urine   4. Itching of hands and feet -  CMET and Bile acids today  Term labor symptoms and general obstetric precautions including but not limited to vaginal bleeding, contractions, leaking of fluid and fetal movement were reviewed in detail with the patient. Please refer to After Visit Summary for other counseling recommendations.   Return in about 1 week (around 05/03/2020) for in person .  No future appointments.  Thressa Sheller DNP, CNM  04/26/20  10:00 AM

## 2020-04-27 LAB — COMPREHENSIVE METABOLIC PANEL
ALT: 8 IU/L (ref 0–32)
AST: 10 IU/L (ref 0–40)
Albumin/Globulin Ratio: 1.4 (ref 1.2–2.2)
Albumin: 3.2 g/dL — ABNORMAL LOW (ref 3.9–5.0)
Alkaline Phosphatase: 161 IU/L — ABNORMAL HIGH (ref 44–121)
BUN/Creatinine Ratio: 7 — ABNORMAL LOW (ref 9–23)
BUN: 3 mg/dL — ABNORMAL LOW (ref 6–20)
Bilirubin Total: 0.8 mg/dL (ref 0.0–1.2)
CO2: 20 mmol/L (ref 20–29)
Calcium: 9.9 mg/dL (ref 8.7–10.2)
Chloride: 106 mmol/L (ref 96–106)
Creatinine, Ser: 0.42 mg/dL — ABNORMAL LOW (ref 0.57–1.00)
GFR calc Af Amer: 159 mL/min/{1.73_m2} (ref >59)
GFR calc non Af Amer: 138 mL/min/{1.73_m2} (ref >59)
Globulin, Total: 2.3 g/dL (ref 1.5–4.5)
Glucose: 72 mg/dL (ref 65–99)
Potassium: 3.2 mmol/L — ABNORMAL LOW (ref 3.5–5.2)
Sodium: 141 mmol/L (ref 134–144)
Total Protein: 5.5 g/dL — ABNORMAL LOW (ref 6.0–8.5)

## 2020-04-27 LAB — CBC
Hematocrit: 36.2 % (ref 34.0–46.6)
Hemoglobin: 12.5 g/dL (ref 11.1–15.9)
MCH: 30.5 pg (ref 26.6–33.0)
MCHC: 34.5 g/dL (ref 31.5–35.7)
MCV: 88 fL (ref 79–97)
Platelets: 245 10*3/uL (ref 150–450)
RBC: 4.1 x10E6/uL (ref 3.77–5.28)
RDW: 13.8 % (ref 11.7–15.4)
WBC: 8.9 10*3/uL (ref 3.4–10.8)

## 2020-04-27 LAB — GC/CHLAMYDIA PROBE AMP (~~LOC~~) NOT AT ARMC
Chlamydia: NEGATIVE
Comment: NEGATIVE
Comment: NORMAL
Neisseria Gonorrhea: NEGATIVE

## 2020-04-27 LAB — BILE ACIDS, TOTAL: Bile Acids Total: 2.5 umol/L (ref 0.0–10.0)

## 2020-04-28 NOTE — Progress Notes (Addendum)
Results back from labs done at visit on 04/26/2020. At that visit patient reported itching or her hands and feet. Concern for ICP, so labs done. Labs are normal and no evidence of ICP. Bile Acids: 2.5  Plan to review results with patient at her next visit.  Consider repeating labs if itching continues. Per up to day itching can proceed abnormal bile acids by several weeks.   Veronica Sheller DNP, CNM  04/28/20  5:10 PM     Results for NEAH, SPORRER (MRN [id number]) as of 04/28/2020 17:09  Ref. Range 04/26/2020 10:07  Sodium Latest Ref Range: 134 - 144 mmol/L 141  Potassium Latest Ref Range: 3.5 - 5.2 mmol/L 3.2 (L)  Chloride Latest Ref Range: 96 - 106 mmol/L 106  CO2 Latest Ref Range: 20 - 29 mmol/L 20  Glucose Latest Ref Range: 65 - 99 mg/dL 72  BUN Latest Ref Range: 6 - 20 mg/dL 3 (L)  Creatinine Latest Ref Range: 0.57 - 1.00 mg/dL 4.50 (L)  Calcium Latest Ref Range: 8.7 - 10.2 mg/dL 9.9  BUN/Creatinine Ratio Latest Ref Range: 9 - 23  7 (L)  Alkaline Phosphatase Latest Ref Range: 44 - 121 IU/L 161 (H)  Albumin Latest Ref Range: 3.9 - 5.0 g/dL 3.2 (L)  Albumin/Globulin Ratio Latest Ref Range: 1.2 - 2.2  1.4  AST Latest Ref Range: 0 - 40 IU/L 10  ALT Latest Ref Range: 0 - 32 IU/L 8  Total Protein Latest Ref Range: 6.0 - 8.5 g/dL 5.5 (L)  Bile Acids Total Latest Ref Range: 0.0 - 10.0 umol/L 2.5  Total Bilirubin Latest Ref Range: 0.0 - 1.2 mg/dL 0.8  GFR, Est Non African American Latest Ref Range: >59 mL/min/1.73 138  GFR, Est African American Latest Ref Range: >59 mL/min/1.73 159

## 2020-05-03 ENCOUNTER — Other Ambulatory Visit: Payer: Self-pay

## 2020-05-03 ENCOUNTER — Ambulatory Visit (INDEPENDENT_AMBULATORY_CARE_PROVIDER_SITE_OTHER): Payer: Medicaid Other | Admitting: Student

## 2020-05-03 VITALS — BP 129/74 | HR 77 | Wt 141.4 lb

## 2020-05-03 DIAGNOSIS — L299 Pruritus, unspecified: Secondary | ICD-10-CM

## 2020-05-03 DIAGNOSIS — O26893 Other specified pregnancy related conditions, third trimester: Secondary | ICD-10-CM

## 2020-05-03 DIAGNOSIS — Z3483 Encounter for supervision of other normal pregnancy, third trimester: Secondary | ICD-10-CM | POA: Diagnosis not present

## 2020-05-03 DIAGNOSIS — R12 Heartburn: Secondary | ICD-10-CM

## 2020-05-03 DIAGNOSIS — O368130 Decreased fetal movements, third trimester, not applicable or unspecified: Secondary | ICD-10-CM

## 2020-05-03 DIAGNOSIS — Z3A37 37 weeks gestation of pregnancy: Secondary | ICD-10-CM

## 2020-05-03 MED ORDER — FAMOTIDINE 20 MG PO TABS: 20.0000 mg | ORAL_TABLET | Freq: Every day | ORAL | 0 refills | Status: DC

## 2020-05-03 NOTE — Progress Notes (Signed)
° °  PRENATAL VISIT NOTE  Subjective:  Veronica Shea is a 29 y.o. G3P2002 at 27w1dbeing seen today for ongoing prenatal care.  She is currently monitored for the following issues for this low-risk pregnancy and has Supervision of normal pregnancy; Fetal growth restriction in prior pregnancy, pregnant; History of herpes genitalis; Group B streptococcal bacteriuria; and Low-lying placenta in second trimester on their problem list.  Patient reports decrease fetal movement & itching.   Reports continued itching since last visit. Bile acids were normal at that time. States itching is all over sporadically but worse on her feet & worse at night. Has not treated symptoms.  Also complaining of daily heartburn that is worse at night. Hasn't treated symptoms.  Decreased movement this morning. Has only felt baby move twice since waking up at 530 this morning.     Contractions: Irritability. Vag. Bleeding: None.  Movement: (!) Decreased. Denies leaking of fluid.   The following portions of the patient's history were reviewed and updated as appropriate: allergies, current medications, past family history, past medical history, past social history, past surgical history and problem list.   Objective:   Vitals:   05/03/20 0834  BP: 129/74  Pulse: 77  Weight: 141 lb 6.4 oz (64.1 kg)    Fetal Status: Fetal Heart Rate (bpm): 144 Fundal Height: 37 cm Movement: (!) Decreased  Presentation: Vertex  General:  Alert, oriented and cooperative. Patient is in no acute distress.  Skin: Skin is warm and dry. No rash noted.   Cardiovascular: Normal heart rate noted  Respiratory: Normal respiratory effort, no problems with respiration noted  Abdomen: Soft, gravid, appropriate for gestational age.  Pain/Pressure: Present     Pelvic: Cervical exam deferred        Extremities: Normal range of motion.  Edema: Trace  Mental Status: Normal mood and affect. Normal behavior. Normal judgment and thought content.     Assessment and Plan:  Pregnancy: G3P2002 at 356w1d. Encounter for supervision of other normal pregnancy in third trimester -GBS pos on urine earlier in pregnancy. NKDA. Discussed treatment in labor.  -reviewed where to present for labor & given WCSheridanap  2. Itching -take benadryl prn. Will repeat labs & treat accordingly.  - Bile acids, total - Comp Met (CMET)  3. [redacted] weeks gestation of pregnancy   4. Heartburn during pregnancy in third trimester  - famotidine (PEPCID) 20 MG tablet; Take 1 tablet (20 mg total) by mouth daily.  Dispense: 30 tablet; Refill: 0  5. Decreased fetal movements in third trimester, single or unspecified fetus -Reactive NST. Baseline 135, moderate variability, 15x15 accels, no decels. Patient reports good movement while being monitored.  - Fetal nonstress test; Future  Term labor symptoms and general obstetric precautions including but not limited to vaginal bleeding, contractions, leaking of fluid and fetal movement were reviewed in detail with the patient. Please refer to After Visit Summary for other counseling recommendations.   No follow-ups on file.  Future Appointments  Date Time Provider DeLuis M. Cintron10/01/2020  9:35 AM DaLaury DeepCNM [facility]10/13/2021 1025:36M FiArrie SenateMD [facility]CM[facility]10/20/2021 10:15 AM Nugent, NiGerrie NordmannNP WMMarlette [facility] HospitalMDiamond BeachNP

## 2020-05-03 NOTE — Patient Instructions (Signed)
The Maternity Assessment Unit (MAU) is located at the [facility] and [facility] at [facility]. The address is: [address] [place], [place], [place] [address]. Please see map below for additional directions.    The Maternity Assessment Unit is designed to help you during your pregnancy, and for up to 6 weeks after delivery, with any pregnancy- or postpartum-related emergencies, if you think you are in labor, or if your water has broken. For example, if you experience nausea and vomiting, vaginal bleeding, severe abdominal or pelvic pain, elevated blood pressure or other problems related to your pregnancy or postpartum time, please come to the Maternity Assessment Unit for assistance.       Before [facility] Once your baby is home with you, things may become a bit hectic as you map out a schedule around your newborn's patterns. Preparing the things you need at home before that time comes is important. Before your baby arrives, make sure you:  Have all the supplies that you will need to care for your baby.  Know where to go if there is an emergency.  Discuss the baby's arrival with other family members. What supplies will I need? Having the following supplies ready before your baby arrives will help ensure that you are prepared: Large items  Crib or bassinet and mattress. Make sure to follow safe sleep recommendations to reduce the risk of sudden infant death syndrome.  Rear-facing infant car seat. Have a trained professional check to make sure that it is installed in your car correctly. Many hospitals and fire departments perform this service free of charge.  Stroller. Always make sure any products--including cribs, mattresses, bassinets, or portable cribs and play areas--are safe. Check for recalls on your specific brand and model of crib. Breastfeeding  Nursing pillow.  Milk storage containers or bags.  Nipple cream.  Nursing bra.  Breast  pads.  Breast pump.  Breast shields. Feeding  Formula.  Purified bottled water.  6-8 bottles (4-5 oz bottles and 8-9 oz bottles).  6-8 bottle nipples.  Bibs and burp cloths.  Bottle brush.  Bottle sterilizer (or a pot with a lid). Bathing  Infant bath basin.  Mild baby soap and baby shampoo.  Soft cloth towel and washcloth.  Hooded towel. Diapering  Diapers. You may need to use as many as 10-12 diapers each day.  Baby wipes.  Diaper cream.  Petroleum jelly.  Changing pad.  Hand sanitizer. Health and safety  Rectal thermometer.  Infant medicines.  Bulb syringe.  Baby nail clippers.  Baby monitor.  2-3 pacifiers, if desired. Sleeping  Sleep sack or swaddling blanket.  Firm mattress pad and fitted sheets for the crib or bassinet. Other supplies  Diaper bag.  Clothing, including one-piece outfits and pajamas.  Receiving blankets. Follow these instructions at home: Preparing for an emergency Prepare for an emergency by taking these steps:  Know when to seek care or call your health care provider.  Know how to get to the nearest hospital.  List the phone numbers of your baby's health care providers near your home phone and in your cell phone.  Take an infant first aid and CPR class.  Place the phone number for the poison control center on your refrigerator.  If there will be caregivers in the home, make sure your phone number, emergency contacts, and address are placed on the refrigerator in case they need to be given to emergency services. Preparing your family   Create a plan  for visitors. Keep your baby away from people who have a cough, fever, or other symptoms of illness.  Prepare freezer meals ahead of time, and ask friends and family to help with meal preparation, errands, and everyday tasks.  If you have other children: ? Talk with them about the baby coming home. Ask them how they feel about it. ? Read a book together about  being a new big brother or sister. ? Find ways to let them help you prepare for the new baby. ? Have someone ready to care for them while you are in the hospital. Where to find more information  Consumer Product Safety Commission: http://johnston-ramirez.com/  American Academy of Pediatrics: www.healthychildren.org  Safe Kids Worldwide: www.safekids.org Summary  Planning is important before bringing your baby home from the hospital. You will need to have certain supplies ready before your baby arrives.  You will need to have a rear-facing infant car seat ready prior to bringing your baby home. Have a trained professional check to make sure that it is installed in your car correctly.  Always make sure any products--including cribs, mattresses, bassinets, or portable cribs and play areas--are safe. Check for recalls on your specific brand and model of crib.  Know when to seek care or call your health care provider, and know how to get to the nearest hospital. This information is not intended to replace advice given to you by your health care provider. Make sure you discuss any questions you have with your health care provider. Document Revised: 07/04/2017 Document Reviewed: 06/11/2017 Elsevier Patient Education  2020 [employer].

## 2020-05-03 NOTE — Progress Notes (Signed)
Pt states baby used to move every hour but now every other hour.

## 2020-05-04 LAB — COMPREHENSIVE METABOLIC PANEL
ALT: 9 IU/L (ref 0–32)
AST: 8 IU/L (ref 0–40)
Albumin/Globulin Ratio: 1.5 (ref 1.2–2.2)
Albumin: 3.5 g/dL — ABNORMAL LOW (ref 3.9–5.0)
Alkaline Phosphatase: 178 IU/L — ABNORMAL HIGH (ref 44–121)
BUN/Creatinine Ratio: 9 (ref 9–23)
BUN: 4 mg/dL — ABNORMAL LOW (ref 6–20)
Bilirubin Total: 0.6 mg/dL (ref 0.0–1.2)
CO2: 19 mmol/L — ABNORMAL LOW (ref 20–29)
Calcium: 9.2 mg/dL (ref 8.7–10.2)
Chloride: 104 mmol/L (ref 96–106)
Creatinine, Ser: 0.44 mg/dL — ABNORMAL LOW (ref 0.57–1.00)
GFR calc Af Amer: 157 mL/min/{1.73_m2} (ref >59)
GFR calc non Af Amer: 136 mL/min/{1.73_m2} (ref >59)
Globulin, Total: 2.3 g/dL (ref 1.5–4.5)
Glucose: 85 mg/dL (ref 65–99)
Potassium: 3.3 mmol/L — ABNORMAL LOW (ref 3.5–5.2)
Sodium: 141 mmol/L (ref 134–144)
Total Protein: 5.8 g/dL — ABNORMAL LOW (ref 6.0–8.5)

## 2020-05-04 LAB — BILE ACIDS, TOTAL: Bile Acids Total: 2.9 umol/L (ref 0.0–10.0)

## 2020-05-10 ENCOUNTER — Encounter: Payer: Self-pay | Admitting: Obstetrics and Gynecology

## 2020-05-10 ENCOUNTER — Ambulatory Visit (INDEPENDENT_AMBULATORY_CARE_PROVIDER_SITE_OTHER): Payer: Medicaid Other | Admitting: Obstetrics and Gynecology

## 2020-05-10 ENCOUNTER — Other Ambulatory Visit: Payer: Self-pay

## 2020-05-10 VITALS — BP 116/71 | HR 81 | Wt 148.0 lb

## 2020-05-10 DIAGNOSIS — O99713 Diseases of the skin and subcutaneous tissue complicating pregnancy, third trimester: Secondary | ICD-10-CM | POA: Diagnosis not present

## 2020-05-10 DIAGNOSIS — O368130 Decreased fetal movements, third trimester, not applicable or unspecified: Secondary | ICD-10-CM | POA: Diagnosis not present

## 2020-05-10 DIAGNOSIS — Z23 Encounter for immunization: Secondary | ICD-10-CM | POA: Diagnosis not present

## 2020-05-10 DIAGNOSIS — Z3483 Encounter for supervision of other normal pregnancy, third trimester: Secondary | ICD-10-CM

## 2020-05-10 DIAGNOSIS — L299 Pruritus, unspecified: Secondary | ICD-10-CM | POA: Diagnosis not present

## 2020-05-10 DIAGNOSIS — Z3689 Encounter for other specified antenatal screening: Secondary | ICD-10-CM

## 2020-05-10 DIAGNOSIS — Z3A38 38 weeks gestation of pregnancy: Secondary | ICD-10-CM

## 2020-05-10 MED ORDER — HYDROXYZINE HCL 25 MG PO TABS: 25.0000 mg | ORAL_TABLET | Freq: Four times a day (QID) | ORAL | 2 refills | Status: DC | PRN

## 2020-05-10 NOTE — Progress Notes (Signed)
Less than usual movement 2 times since 5am

## 2020-05-10 NOTE — Progress Notes (Signed)
   LOW-RISK PREGNANCY OFFICE VISIT Patient name: Veronica Shea MRN [id number]  Date of birth: 1991/06/16 Chief Complaint:   Routine Prenatal Visit  History of Present Illness:   Veronica Shea is a 29 y.o. G45P2002 female at [redacted]w[redacted]d with an Estimated Date of Delivery: 05/23/20 being seen today for ongoing management of a low-risk pregnancy.  Today she reports occasional contractions and decreased FM x 1 week (only 2 movements felt since 0500 today). She reports the itching is still the same. She uses "foot lotion" and tries to go to sleep, but it is hard. Contractions: Irritability. Vag. Bleeding: None.  Movement: (!) Decreased. denies leaking of fluid. Review of Systems:   Pertinent items are noted in HPI Denies abnormal vaginal discharge w/ itching/odor/irritation, headaches, visual changes, shortness of breath, chest pain, abdominal pain, severe nausea/vomiting, or problems with urination or bowel movements unless otherwise stated above. Pertinent History Reviewed:  Reviewed past medical,surgical, social, obstetrical and family history.  Reviewed problem list, medications and allergies. Physical Assessment:   Vitals:   05/10/20 0952  BP: 116/71  Pulse: 81  Weight: 148 lb (67.1 kg)  Body mass index is 30.93 kg/m.        Physical Examination:   General appearance: Well appearing, and in no distress  Mental status: Alert, oriented to person, place, and time  Skin: Warm & dry  Cardiovascular: Normal heart rate noted  Respiratory: Normal respiratory effort, no distress  Abdomen: Soft, gravid, nontender  Pelvic: Cervical exam performed  Dilation: 2 Effacement (%): Thick Station: -3  Extremities: Edema: Trace  Fetal Status: Fetal Heart Rate (bpm): 143 Fundal Height: 338 cm Movement: (!) Decreased Presentation: Vertex  REACTIVE NST - FHR: 130 bpm / moderate variability / accels present / decels absent / TOCO: regular every 2-6 mins   Assessment & Plan:  1) Low-risk  pregnancy G3P2002 at [redacted]w[redacted]d with an Estimated Date of Delivery: 05/23/20   2) Encounter for supervision of other normal pregnancy in third trimester  - Flu Vaccine QUAD 36+ mos IM - Information provided on signs of labor   3) Pruritus of pregnancy in third trimester  - Bile acids, total,  - Comp Met (CMET),  - Rx for hydrOXYzine (ATARAX/VISTARIL) 25 MG tablet every 6 hours prn ithcing  4) Decreased fetal movements in third trimester, single or unspecified fetus - NST  5) NST (non-stress test) reactive - Reassurance given that baby is doing well on the monitor - Advised to continue daily FKC  6) [redacted] weeks gestation of pregnancy    Meds:  Meds ordered this encounter  Medications  . hydrOXYzine (ATARAX/VISTARIL) 25 MG tablet    Sig: Take 1 tablet (25 mg total) by mouth every 6 (six) hours as needed for itching.    Dispense:  30 tablet    Refill:  2    Order Specific Question:   Supervising Provider    Answer:   Merrily Pew   Labs/procedures today: NST  Plan:  Continue routine obstetrical care   Reviewed: Term labor symptoms and general obstetric precautions including but not limited to vaginal bleeding, contractions, leaking of fluid and fetal movement were reviewed in detail with the patient.  All questions were answered.   Follow-up: Return in about 1 week (around 05/17/2020) for Return OB visit.  Orders Placed This Encounter  Procedures  . Flu Vaccine QUAD 36+ mos IM  . Bile acids, total  . Comp Met (CMET)   Laury Deep MSN, CNM 05/10/2020 10:17 AM

## 2020-05-10 NOTE — Patient Instructions (Signed)

## 2020-05-11 LAB — BILE ACIDS, TOTAL: Bile Acids Total: 4.8 umol/L (ref 0.0–10.0)

## 2020-05-11 LAB — COMPREHENSIVE METABOLIC PANEL
ALT: 9 IU/L (ref 0–32)
AST: 14 IU/L (ref 0–40)
Albumin/Globulin Ratio: 1.5 (ref 1.2–2.2)
Albumin: 3.5 g/dL — ABNORMAL LOW (ref 3.9–5.0)
Alkaline Phosphatase: 167 IU/L — ABNORMAL HIGH (ref 44–121)
BUN/Creatinine Ratio: 17 (ref 9–23)
BUN: 7 mg/dL (ref 6–20)
Bilirubin Total: 0.7 mg/dL (ref 0.0–1.2)
CO2: 17 mmol/L — ABNORMAL LOW (ref 20–29)
Calcium: 8.6 mg/dL — ABNORMAL LOW (ref 8.7–10.2)
Chloride: 105 mmol/L (ref 96–106)
Creatinine, Ser: 0.42 mg/dL — ABNORMAL LOW (ref 0.57–1.00)
GFR calc Af Amer: 159 mL/min/{1.73_m2} (ref >59)
GFR calc non Af Amer: 138 mL/min/{1.73_m2} (ref >59)
Globulin, Total: 2.4 g/dL (ref 1.5–4.5)
Glucose: 119 mg/dL — ABNORMAL HIGH (ref 65–99)
Potassium: 3 mmol/L — ABNORMAL LOW (ref 3.5–5.2)
Sodium: 139 mmol/L (ref 134–144)
Total Protein: 5.9 g/dL — ABNORMAL LOW (ref 6.0–8.5)

## 2020-05-17 ENCOUNTER — Other Ambulatory Visit: Payer: Self-pay

## 2020-05-17 ENCOUNTER — Ambulatory Visit (INDEPENDENT_AMBULATORY_CARE_PROVIDER_SITE_OTHER): Payer: Medicaid Other | Admitting: Family Medicine

## 2020-05-17 VITALS — BP 124/78 | HR 73 | Wt 149.3 lb

## 2020-05-17 DIAGNOSIS — O09299 Supervision of pregnancy with other poor reproductive or obstetric history, unspecified trimester: Secondary | ICD-10-CM

## 2020-05-17 DIAGNOSIS — Z8619 Personal history of other infectious and parasitic diseases: Secondary | ICD-10-CM

## 2020-05-17 DIAGNOSIS — R8271 Bacteriuria: Secondary | ICD-10-CM

## 2020-05-17 DIAGNOSIS — Z3483 Encounter for supervision of other normal pregnancy, third trimester: Secondary | ICD-10-CM

## 2020-05-17 NOTE — Progress Notes (Signed)
IOL faxed to L&D.  Pt informed that L&D will call her with the time and further information.  Pt verbalized understanding.   Addison Naegeli, RN  05/17/20

## 2020-05-17 NOTE — Progress Notes (Signed)
   PRENATAL VISIT NOTE  Subjective:  Veronica Shea is a 29 y.o. G3P2002 at [redacted]w[redacted]d being seen today for ongoing prenatal care.  She is currently monitored for the following issues for this low-risk pregnancy and has Supervision of normal pregnancy; Fetal growth restriction in prior pregnancy, pregnant; History of herpes genitalis; Group B streptococcal bacteriuria; Low-lying placenta in second trimester; and Pruritus of pregnancy in third trimester on their problem list.  Patient reports occasional contractions and pelvic pressure.  Contractions: Irregular. Vag. Bleeding: None.  Movement: Present. Denies leaking of fluid.   The following portions of the patient's history were reviewed and updated as appropriate: allergies, current medications, past family history, past medical history, past social history, past surgical history and problem list.   Objective:   Vitals:   05/17/20 1023  BP: 124/78  Pulse: 73  Weight: 149 lb 4.8 oz (67.7 kg)    Fetal Status: Fetal Heart Rate (bpm): 150   Movement: Present     General:  Alert, oriented and cooperative. Patient is in no acute distress.  Skin: Skin is warm and dry. No rash noted.   Cardiovascular: Normal heart rate noted  Respiratory: Normal respiratory effort, no problems with respiration noted  Abdomen: Soft, gravid, appropriate for gestational age.  Pain/Pressure: Present     Pelvic: Cervical exam performed in the presence of a chaperone        Extremities: Normal range of motion.  Edema: Trace  Mental Status: Normal mood and affect. Normal behavior. Normal judgment and thought content.   Assessment and Plan:  Pregnancy: G3P2002 at [redacted]w[redacted]d 1. Encounter for supervision of other normal pregnancy in third trimester -taking prenatals -cervical exam likely unchanged from prior visit, checked today given feeling some pressure/intermittent contractions -IOL-PD scheduled 05/30/20  2. History of herpes genitalis Taking valtrex, no  lesions noted on external exam today.  3. Group B streptococcal bacteriuria Will need prophylaxis.  4. Fetal growth restriction in prior pregnancy, pregnant 04/19/20 EFW 2652g, 53%ile  Term labor symptoms and general obstetric precautions including but not limited to vaginal bleeding, contractions, leaking of fluid and fetal movement were reviewed in detail with the patient. Please refer to After Visit Summary for other counseling recommendations.   Return in about 1 week (around 05/24/2020) for LOB; in person; any provider.  Future Appointments  Date Time Provider Department Center  05/24/2020 10:15 AM Nugent, Odie Sera, NP [facility]    Alric Seton, MD

## 2020-05-18 ENCOUNTER — Encounter (HOSPITAL_COMMUNITY): Payer: Self-pay | Admitting: *Deleted

## 2020-05-18 ENCOUNTER — Telehealth (HOSPITAL_COMMUNITY): Payer: Self-pay | Admitting: *Deleted

## 2020-05-18 NOTE — Telephone Encounter (Signed)
Preadmission screen  

## 2020-05-22 ENCOUNTER — Other Ambulatory Visit (HOSPITAL_COMMUNITY): Payer: Self-pay | Admitting: Advanced Practice Midwife

## 2020-05-23 ENCOUNTER — Ambulatory Visit: Payer: Medicaid Other

## 2020-05-23 ENCOUNTER — Encounter (HOSPITAL_COMMUNITY): Payer: Self-pay | Admitting: Obstetrics & Gynecology

## 2020-05-23 ENCOUNTER — Inpatient Hospital Stay (HOSPITAL_COMMUNITY)
Admission: AD | Admit: 2020-05-23 | Discharge: 2020-05-25 | DRG: 797 | Disposition: A | Discharge status: Home / Self Care | Payer: Medicaid Other | Attending: Family Medicine | Admitting: Family Medicine

## 2020-05-23 ENCOUNTER — Other Ambulatory Visit: Payer: Self-pay

## 2020-05-23 DIAGNOSIS — Z3A40 40 weeks gestation of pregnancy: Secondary | ICD-10-CM | POA: Diagnosis not present

## 2020-05-23 DIAGNOSIS — A6000 Herpesviral infection of urogenital system, unspecified: Secondary | ICD-10-CM | POA: Diagnosis present

## 2020-05-23 DIAGNOSIS — Z6791 Unspecified blood type, Rh negative: Secondary | ICD-10-CM | POA: Diagnosis not present

## 2020-05-23 DIAGNOSIS — O1404 Mild to moderate pre-eclampsia, complicating childbirth: Secondary | ICD-10-CM | POA: Diagnosis present

## 2020-05-23 DIAGNOSIS — O9832 Other infections with a predominantly sexual mode of transmission complicating childbirth: Secondary | ICD-10-CM | POA: Diagnosis not present

## 2020-05-23 DIAGNOSIS — O26893 Other specified pregnancy related conditions, third trimester: Secondary | ICD-10-CM | POA: Diagnosis not present

## 2020-05-23 DIAGNOSIS — O165 Unspecified maternal hypertension, complicating the puerperium: Secondary | ICD-10-CM

## 2020-05-23 DIAGNOSIS — O1414 Severe pre-eclampsia complicating childbirth: Secondary | ICD-10-CM | POA: Diagnosis not present

## 2020-05-23 DIAGNOSIS — Z302 Encounter for sterilization: Secondary | ICD-10-CM | POA: Diagnosis not present

## 2020-05-23 DIAGNOSIS — Z8619 Personal history of other infectious and parasitic diseases: Secondary | ICD-10-CM | POA: Diagnosis present

## 2020-05-23 DIAGNOSIS — O480 Post-term pregnancy: Secondary | ICD-10-CM | POA: Diagnosis not present

## 2020-05-23 DIAGNOSIS — Z20822 Contact with and (suspected) exposure to covid-19: Secondary | ICD-10-CM | POA: Diagnosis not present

## 2020-05-23 DIAGNOSIS — O99824 Streptococcus B carrier state complicating childbirth: Secondary | ICD-10-CM | POA: Diagnosis not present

## 2020-05-23 DIAGNOSIS — O99713 Diseases of the skin and subcutaneous tissue complicating pregnancy, third trimester: Secondary | ICD-10-CM | POA: Diagnosis present

## 2020-05-23 DIAGNOSIS — O09299 Supervision of pregnancy with other poor reproductive or obstetric history, unspecified trimester: Secondary | ICD-10-CM

## 2020-05-23 DIAGNOSIS — O4442 Low lying placenta NOS or without hemorrhage, second trimester: Secondary | ICD-10-CM | POA: Diagnosis present

## 2020-05-23 DIAGNOSIS — L299 Pruritus, unspecified: Secondary | ICD-10-CM | POA: Diagnosis present

## 2020-05-23 DIAGNOSIS — Z3490 Encounter for supervision of normal pregnancy, unspecified, unspecified trimester: Secondary | ICD-10-CM

## 2020-05-23 DIAGNOSIS — R8271 Bacteriuria: Secondary | ICD-10-CM | POA: Diagnosis present

## 2020-05-23 LAB — URINALYSIS, ROUTINE W REFLEX MICROSCOPIC
Bilirubin Urine: NEGATIVE
Glucose, UA: NEGATIVE mg/dL
Hgb urine dipstick: NEGATIVE
Ketones, ur: NEGATIVE mg/dL
Nitrite: NEGATIVE
Protein, ur: 30 mg/dL — AB
Specific Gravity, Urine: 1.015 (ref 1.005–1.030)
pH: 7 (ref 5.0–8.0)

## 2020-05-23 LAB — COMPREHENSIVE METABOLIC PANEL
ALT: 10 U/L (ref 0–44)
AST: 15 U/L (ref 15–41)
Albumin: 2.4 g/dL — ABNORMAL LOW (ref 3.5–5.0)
Alkaline Phosphatase: 144 U/L — ABNORMAL HIGH (ref 38–126)
Anion gap: 10 (ref 5–15)
BUN: 5 mg/dL — ABNORMAL LOW (ref 6–20)
CO2: 22 mmol/L (ref 22–32)
Calcium: 8.5 mg/dL — ABNORMAL LOW (ref 8.9–10.3)
Chloride: 107 mmol/L (ref 98–111)
Creatinine, Ser: 0.45 mg/dL (ref 0.44–1.00)
GFR, Estimated: >60 mL/min (ref >60)
Glucose, Bld: 79 mg/dL (ref 70–99)
Potassium: 3.1 mmol/L — ABNORMAL LOW (ref 3.5–5.1)
Sodium: 139 mmol/L (ref 135–145)
Total Bilirubin: 1.1 mg/dL (ref 0.3–1.2)
Total Protein: 5.7 g/dL — ABNORMAL LOW (ref 6.5–8.1)

## 2020-05-23 LAB — TYPE AND SCREEN
ABO/RH(D): A POS
Antibody Screen: NEGATIVE

## 2020-05-23 LAB — RESPIRATORY PANEL BY RT PCR (FLU A&B, COVID)
Influenza A by PCR: NEGATIVE
Influenza B by PCR: NEGATIVE
SARS Coronavirus 2 by RT PCR: NEGATIVE

## 2020-05-23 LAB — CBC
HCT: 37.1 % (ref 36.0–46.0)
Hemoglobin: 12.3 g/dL (ref 12.0–15.0)
MCH: 29.6 pg (ref 26.0–34.0)
MCHC: 33.2 g/dL (ref 30.0–36.0)
MCV: 89.4 fL (ref 80.0–100.0)
Platelets: 206 10*3/uL (ref 150–400)
RBC: 4.15 MIL/uL (ref 3.87–5.11)
RDW: 14.9 % (ref 11.5–15.5)
WBC: 9.5 10*3/uL (ref 4.0–10.5)
nRBC: 0 % (ref 0.0–0.2)

## 2020-05-23 LAB — PROTEIN / CREATININE RATIO, URINE
Creatinine, Urine: 109.02 mg/dL
Protein Creatinine Ratio: 0.44 mg/mg{Cre} — ABNORMAL HIGH (ref 0.00–0.15)
Total Protein, Urine: 48 mg/dL

## 2020-05-23 MED ORDER — SOD CITRATE-CITRIC ACID 500-334 MG/5ML PO SOLN: 30.0000 mL | ORAL | Status: DC | PRN

## 2020-05-23 MED ORDER — LACTATED RINGERS IV SOLN: INTRAVENOUS | Status: DC

## 2020-05-23 MED ORDER — TERBUTALINE SULFATE 1 MG/ML IJ SOLN: 0.2500 mg | Freq: Once | INTRAMUSCULAR | Status: DC | PRN

## 2020-05-23 MED ORDER — LIDOCAINE HCL (PF) 1 % IJ SOLN: 30.0000 mL | INTRAMUSCULAR | Status: DC | PRN

## 2020-05-23 MED ORDER — OXYTOCIN BOLUS FROM INFUSION
333.0000 mL | Freq: Once | INTRAVENOUS | Status: AC
  Administered 2020-05-23: 333 mL via INTRAVENOUS

## 2020-05-23 MED ORDER — FLEET ENEMA 7-19 GM/118ML RE ENEM: 1.0000 | ENEMA | Freq: Once | RECTAL | Status: DC

## 2020-05-23 MED ORDER — PENICILLIN G POT IN DEXTROSE 60000 UNIT/ML IV SOLN
3.0000 10*6.[IU] | INTRAVENOUS | Status: DC
  Administered 2020-05-23 (×2): 3 10*6.[IU] via INTRAVENOUS
  Filled 2020-05-23 (×2): qty 50

## 2020-05-23 MED ORDER — ONDANSETRON HCL 4 MG/2ML IJ SOLN: 4.0000 mg | Freq: Four times a day (QID) | INTRAMUSCULAR | Status: DC | PRN

## 2020-05-23 MED ORDER — FENTANYL CITRATE (PF) 100 MCG/2ML IJ SOLN
100.0000 ug | INTRAMUSCULAR | Status: DC | PRN
  Administered 2020-05-23: 100 ug via INTRAVENOUS
  Filled 2020-05-23: qty 2

## 2020-05-23 MED ORDER — OXYTOCIN-SODIUM CHLORIDE 30-0.9 UT/500ML-% IV SOLN
1.0000 m[IU]/min | INTRAVENOUS | Status: DC
  Administered 2020-05-23: 2 m[IU]/min via INTRAVENOUS
  Filled 2020-05-23: qty 500

## 2020-05-23 MED ORDER — POTASSIUM CHLORIDE 20 MEQ PO PACK
40.0000 meq | PACK | Freq: Once | ORAL | Status: AC
  Administered 2020-05-23: 40 meq via ORAL
  Filled 2020-05-23: qty 2

## 2020-05-23 MED ORDER — OXYCODONE-ACETAMINOPHEN 5-325 MG PO TABS: 1.0000 | ORAL_TABLET | ORAL | Status: DC | PRN

## 2020-05-23 MED ORDER — OXYCODONE-ACETAMINOPHEN 5-325 MG PO TABS: 2.0000 | ORAL_TABLET | ORAL | Status: DC | PRN

## 2020-05-23 MED ORDER — ACETAMINOPHEN 325 MG PO TABS
650.0000 mg | ORAL_TABLET | ORAL | Status: DC | PRN
  Administered 2020-05-23: 650 mg via ORAL
  Filled 2020-05-23: qty 2

## 2020-05-23 MED ORDER — SODIUM CHLORIDE 0.9 % IV SOLN
5.0000 10*6.[IU] | Freq: Once | INTRAVENOUS | Status: AC
  Administered 2020-05-23: 5 10*6.[IU] via INTRAVENOUS
  Filled 2020-05-23: qty 5

## 2020-05-23 MED ORDER — LACTATED RINGERS IV SOLN
500.0000 mL | INTRAVENOUS | Status: DC | PRN
  Administered 2020-05-23: 1000 mL via INTRAVENOUS

## 2020-05-23 MED ORDER — OXYTOCIN-SODIUM CHLORIDE 30-0.9 UT/500ML-% IV SOLN
2.5000 [IU]/h | INTRAVENOUS | Status: DC
  Administered 2020-05-23: 2.5 [IU]/h via INTRAVENOUS

## 2020-05-23 NOTE — Discharge Summary (Signed)
Postpartum Discharge Summary  Date of Service updated 05/25/20     Patient Name: Veronica Shea DOB: 03/22/1991 MRN: [id number]  Date of admission: 05/23/2020 Delivery date:05/23/2020  Delivering provider: Wells Guiles R  Date of discharge: 05/25/2020  Admitting diagnosis: Normal labor [O80, Z37.9] Post-dates pregnancy [O48.0] Intrauterine pregnancy: [redacted]w[redacted]d    Secondary diagnosis:  Active Problems:   Supervision of normal pregnancy   Fetal growth restriction in prior pregnancy, pregnant   History of herpes genitalis   Group B streptococcal bacteriuria   Low-lying placenta in second trimester   Pruritus of pregnancy in third trimester   Vaginal delivery   Post-dates pregnancy   Postpartum hypertension  Additional problems: none    Discharge diagnosis: Term Pregnancy Delivered and Preeclampsia (mild)                                              Post partum procedures:postpartum tubal ligation Augmentation: AROM, Pitocin and IP Foley Complications: None  Hospital course: Induction of Labor With Vaginal Delivery   29y.o. yo [id number] admitted to the hospital 05/23/2020 for induction of labor.  Indication for induction: Preeclampsia.  Patient had an uncomplicated labor course as follows: Membrane Rupture Time/Date: 8:43 PM ,05/23/2020   Delivery Method:Vaginal, Spontaneous  Episiotomy: None  Lacerations:  None  Details of delivery can be found in separate delivery note.  Patient had a routine postpartum course. Patient is discharged home 05/25/20.  Newborn Data: Birth date:05/23/2020  Birth time:10:44 PM  Gender:Female  Living status:Living  Apgars:8 ,9  Weight:3150 g   Magnesium Sulfate received: No BMZ received: No Rhophylac:N/A MMR:N/A T-DaP:Given prenatally Flu: Given prenatally Transfusion:No  Physical exam  Vitals:   05/24/20 1635 05/24/20 1745 05/24/20 2103 05/25/20 0125  BP: (!) 150/90 (!) 137/92 122/77 130/87  Pulse: 64 66  75 70  Resp: _0 Temp: 97.6 F (36.4 C) 98.2 F (36.8 C) 98.4 F (36.9 C) 98 F (36.7 C)  TempSrc: Oral Oral    SpO2:  99% 98%   Weight:      Height:       General: alert, cooperative and no distress Lochia: appropriate Uterine Fundus: firm Incision: Healing well with no significant drainage DVT Evaluation: No evidence of DVT seen on physical exam. Labs: Lab Results  Component Value Date   WBC 15.4 (H) 05/24/2020   HGB 10.4 (L) 05/24/2020   HCT 29.9 (L) 05/24/2020   MCV 90.3 05/24/2020   PLT 197 05/24/2020   CMP Latest Ref Rng & Units 05/23/2020  Glucose 70 - 99 mg/dL 79  BUN 6 - 20 mg/dL 5(L)  Creatinine 0.44 - 1.00 mg/dL 0.45  Sodium 135 - 145 mmol/L 139  Potassium 3.5 - 5.1 mmol/L 3.1(L)  Chloride 98 - 111 mmol/L 107  CO2 22 - 32 mmol/L 22  Calcium 8.9 - 10.3 mg/dL 8.5(L)  Total Protein 6.5 - 8.1 g/dL 5.7(L)  Total Bilirubin 0.3 - 1.2 mg/dL 1.1  Alkaline Phos 38 - 126 U/L 144(H)  AST 15 - 41 U/L 15  ALT 0 - 44 U/L 10   Edinburgh Score: Edinburgh Postnatal Depression Scale Screening Tool 05/24/2020  I have been able to laugh and see the funny side of things. (No Data)     After visit meds:  Allergies as of 05/25/2020   No Known  Allergies     Medication List    STOP taking these medications   valACYclovir 1000 MG tablet Commonly known as: VALTREX     TAKE these medications   acetaminophen 325 MG tablet Commonly known as: Tylenol Take 2 tablets (650 mg total) by mouth every 4 (four) hours as needed (for pain scale < 4).   amLODipine 5 MG tablet Commonly known as: NORVASC Take 1 tablet (5 mg total) by mouth daily.   famotidine 20 MG tablet Commonly known as: Pepcid Take 1 tablet (20 mg total) by mouth daily.   hydrOXYzine 25 MG tablet Commonly known as: ATARAX/VISTARIL Take 1 tablet (25 mg total) by mouth every 6 (six) hours as needed for itching.   ibuprofen 600 MG tablet Commonly known as: ADVIL Take 1 tablet (600 mg total) by  mouth every 6 (six) hours.   multivitamin-prenatal 27-0.8 MG Tabs tablet Take 1 tablet by mouth daily at 12 noon.        Discharge home in stable condition Infant Feeding: Bottle and Breast Infant Disposition:home with mother Discharge instruction: per After Visit Summary and Postpartum booklet. Activity: Advance as tolerated. Pelvic rest for 6 weeks.  Diet: routine diet Future Appointments: Future Appointments  Date Time Provider [place]  05/29/2020  9:35 AM [facility] None   Follow up Visit: Roma Schanz, CNM  P [facility] Admin Pool Please schedule this patient for PP visit in: 1 week bp check, 4wk pp visit  High risk pregnancy complicated by: pre-e w/o severe features  Delivery mode: SVD  Anticipated Birth Control: BTL done PP  PP Procedures needed: BP check  Schedule Integrated BH visit: no  Provider: Any provider    96/22/2979 Arrie Senate, MD

## 2020-05-23 NOTE — Progress Notes (Signed)
Patient ID: Veronica Shea, female   DOB: 06/04/1991, 29 y.o.   MRN: [id number] Veronica Shea is a 29 y.o. [id number] at [redacted]w[redacted]d admitted for induction of labor due to preeclampsia w/o severe features.  Subjective: Doing well, Denies ha, visual changes, ruq/epigastric pain, n/v.    Objective: BP (!) 136/92   Pulse 69   Temp 98.4 F (36.9 C) (Oral)   Resp 18   Ht 4\' 9"  (1.448 m)   Wt 68 kg   LMP 08/17/2019   SpO2 100%   BMI 32.44 kg/m  No intake/output data recorded.  FHT:  FHR: 135 bpm, variability: moderate,  accelerations:  Present,  decelerations:  Absent UC:   regular, every 2-3 minutes  SVE:   Dilation: 4.5 Effacement (%): 50 Station: -2 Exam by:: Kim Andrew Soria, CNM   AROM forebag  Pitocin @ 8 mu/min  Labs: Lab Results  Component Value Date   WBC 9.5 05/23/2020   HGB 12.3 05/23/2020   HCT 37.1 05/23/2020   MCV 89.4 05/23/2020   PLT 206 05/23/2020    Assessment / Plan: IOL d/t pre-e w/o severe features, s/p foley bulb that came out @ 1415 w/ SROM at same time; forebag AROM'd now  Labor: Progressing normally Fetal Wellbeing:  Category I Pain Control:  Labor support without medications Pre-eclampsia: bp's stable, asymptomatic I/D:  PCN for GBS+ Anticipated MOD:  NSVD  05/25/2020 CNM, WHNP-BC 05/23/2020, 8:51 PM

## 2020-05-23 NOTE — H&P (Addendum)
OBSTETRIC ADMISSION HISTORY AND PHYSICAL  Lucilla Petrenko is a 29 y.o. female G20P2002 with IUP at [redacted]w[redacted]d by LMP presenting for Labor check and found to have mild range BPs in 140s-150s/90s in the MAU with report of mild, frontal headaches. She reports +FMs, Questionable LOF, no VB, no blurry vision, or peripheral edema, and RUQ pain.  She plans on breast and bottle feeding. She request BTL for birth control. She received her prenatal care at [facility]   Dating: By LMP --->  Estimated Date of Delivery: 05/23/20  Sono:  @[redacted]w[redacted]d , CWD, normal anatomy, cephalic presentation, Posterior Placenta, 2652g, 53% EFW   Prenatal History/Complications:  Patient Active Problem List   Diagnosis Date Noted  . Normal labor 05/23/2020  . Pruritus of pregnancy in third trimester 05/10/2020  . Low-lying placenta in second trimester 01/21/2020  . Group B streptococcal bacteriuria 11/29/2019  . History of herpes genitalis 01/13/2014  . Supervision of normal pregnancy 08/19/2013  . Fetal growth restriction in prior pregnancy, pregnant 08/19/2013     Past Medical History: Past Medical History:  Diagnosis Date  . Genital herpes     Past Surgical History: Past Surgical History:  Procedure Laterality Date  . NO PAST SURGERIES      Obstetrical History: OB History     Gravida  3   Para  2   Term  2   Preterm  0   AB  0   Living  2      SAB  0   TAB  0   Ectopic  0   Multiple  0   Live Births  2           Social History Social History   Socioeconomic History  . Marital status: Single    Spouse name: Not on file  . Number of children: Not on file  . Years of education: Not on file  . Highest education level: Not on file  Occupational History  . Not on file  Tobacco Use  . Smoking status: Never Smoker  . Smokeless tobacco: Never Used  Vaping Use  . Vaping Use: Never used  Substance and Sexual Activity  . Alcohol use: No  . Drug use: No  . Sexual activity: Yes   Other Topics Concern  . Not on file  Social History Narrative  . Not on file   Social Determinants of Health   Financial Resource Strain:   . Difficulty of Paying Living Expenses: Not on file  Food Insecurity: No Food Insecurity  . Worried About 08/21/2013 in the Last Year: Never true  . Ran Out of Food in the Last Year: Never true  Transportation Needs: No Transportation Needs  . Lack of Transportation (Medical): No  . Lack of Transportation (Non-Medical): No  Physical Activity:   . Days of Exercise per Week: Not on file  . Minutes of Exercise per Session: Not on file  Stress:   . Feeling of Stress : Not on file  Social Connections:   . Frequency of Communication with Friends and Family: Not on file  . Frequency of Social Gatherings with Friends and Family: Not on file  . Attends Religious Services: Not on file  . Active Member of Clubs or Organizations: Not on file  . Attends Programme researcher, broadcasting/film/video Meetings: Not on file  . Marital Status: Not on file    Family History: Family History  Problem Relation Age of Onset  . Hypertension Father   . Diabetes  Sister     Allergies: No Known Allergies  Medications Prior to Admission  Medication Sig Dispense Refill Last Dose  . famotidine (PEPCID) 20 MG tablet Take 1 tablet (20 mg total) by mouth daily. 30 tablet 0 05/22/2020 at Unknown time  . hydrOXYzine (ATARAX/VISTARIL) 25 MG tablet Take 1 tablet (25 mg total) by mouth every 6 (six) hours as needed for itching. 30 tablet 2 05/22/2020 at Unknown time  . Prenatal Vit-Fe Fumarate-FA (MULTIVITAMIN-PRENATAL) 27-0.8 MG TABS tablet Take 1 tablet by mouth daily at 12 noon. 30 tablet 12 05/22/2020 at Unknown time  . valACYclovir (VALTREX) 1000 MG tablet Take 0.5 tablets (500 mg total) by mouth 2 (two) times daily. 60 tablet 1 05/22/2020 at Unknown time    Review of Systems   All systems reviewed and negative except as stated in HPI  Blood pressure 126/84, pulse 71,  temperature 98.2 F (36.8 C), temperature source Oral, resp. rate 18, height 4\' 9"  (1.448 m), weight 68 kg, last menstrual period 08/17/2019, SpO2 100 %, currently breastfeeding. General appearance: alert, appears stated age and no distress Heart: regular rate and rhythm Abdomen: soft, non-tender;  Extremities: no sign of DVT Pelvic: Normal discharge without vaginal pooling. No visible lesions on cervix. Presentation: cephalic Fetal monitoringBaseline: 150 bpm, Variability: Good {> 6 bpm), Accelerations: Reactive and Decelerations: Occurs rare Uterine activityFrequency: 6 times per hour Dilation: 3 Effacement (%): 60 Station: -1, 0 Exam by:: Dr. Barb MerinoGoswick   Prenatal labs: ABO, Rh: --/--/A POS (10/19 1120) Antibody: NEG (10/19 1120) Rubella: 3.72 (04/20 0956) RPR: Non Reactive (07/16 0825)  HBsAg: Negative (04/20 0956)  HIV: Non Reactive (07/16 0825)  GBS: Positive/-- (04/20 0000) Positive Urine culture 1 hr Glucola wnl Genetic screening: NIPS low risk Anatomy US: wnl except low lying placenta, resolved on 04/19/2020> resolved  Prenatal Transfer Tool  Maternal Diabetes: No Genetic Screening: Normal Maternal Ultrasounds/Referrals: Normal Fetal Ultrasounds or other Referrals:  None Maternal Substance Abuse:  No Significant Maternal Medications:  Meds include: Other: Valtrex Significant Maternal Lab Results: Group B Strep positive and Rh negative  Results for orders placed or performed during the hospital encounter of 05/23/20 (from the past 24 hour(s))  Respiratory Panel by RT PCR (Flu A&B, Covid) - Nasopharyngeal Swab   Collection Time: 05/23/20 11:20 AM   Specimen: Nasopharyngeal Swab  Result Value Ref Range   SARS Coronavirus 2 by RT PCR NEGATIVE NEGATIVE   Influenza A by PCR NEGATIVE NEGATIVE   Influenza B by PCR NEGATIVE NEGATIVE  Comprehensive metabolic panel   Collection Time: 05/23/20 11:20 AM  Result Value Ref Range   Sodium 139 135 - 145 mmol/L   Potassium 3.1  (L) 3.5 - 5.1 mmol/L   Chloride 107 98 - 111 mmol/L   CO2 22 22 - 32 mmol/L   Glucose, Bld 79 70 - 99 mg/dL   BUN 5 (L) 6 - 20 mg/dL   Creatinine, Ser 2.950.45 0.44 - 1.00 mg/dL   Calcium 8.5 (L) 8.9 - 10.3 mg/dL   Total Protein 5.7 (L) 6.5 - 8.1 g/dL   Albumin 2.4 (L) 3.5 - 5.0 g/dL   AST 15 15 - 41 U/L   ALT 10 0 - 44 U/L   Alkaline Phosphatase 144 (H) 38 - 126 U/L   Total Bilirubin 1.1 0.3 - 1.2 mg/dL   GFR, Estimated >28>60 >41>60 mL/min   Anion gap 10 5 - 15  CBC   Collection Time: 05/23/20 11:20 AM  Result Value Ref Range   WBC  9.5 4.0 - 10.5 K/uL   RBC 4.15 3.87 - 5.11 MIL/uL   Hemoglobin 12.3 12.0 - 15.0 g/dL   HCT 74.0 36 - 46 %   MCV 89.4 80.0 - 100.0 fL   MCH 29.6 26.0 - 34.0 pg   MCHC 33.2 30.0 - 36.0 g/dL   RDW 81.4 48.1 - 85.6 %   Platelets 206 150 - 400 K/uL   nRBC 0.0 0.0 - 0.2 %  Type and screen [facility]   Collection Time: 05/23/20 11:20 AM  Result Value Ref Range   ABO/RH(D) A POS    Antibody Screen NEG    Sample Expiration      05/26/2020,2359 Performed at [facility], [address] [place], [place] [address]   Urinalysis, Routine w reflex microscopic Urine, Clean Catch   Collection Time: 05/23/20 11:43 AM  Result Value Ref Range   Color, Urine YELLOW YELLOW   APPearance CLOUDY (A) CLEAR   Specific Gravity, Urine 1.015 1.005 - 1.030   pH 7.0 5.0 - 8.0   Glucose, UA NEGATIVE NEGATIVE mg/dL   Hgb urine dipstick NEGATIVE NEGATIVE   Bilirubin Urine NEGATIVE NEGATIVE   Ketones, ur NEGATIVE NEGATIVE mg/dL   Protein, ur 30 (A) NEGATIVE mg/dL   Nitrite NEGATIVE NEGATIVE   Leukocytes,Ua LARGE (A) NEGATIVE   RBC / HPF 0-5 0 - 5 RBC/hpf   WBC, UA 21-50 0 - 5 WBC/hpf   Bacteria, UA RARE (A) NONE SEEN   Squamous Epithelial / LPF 11-20 0 - 5   Mucus PRESENT   Protein / creatinine ratio, urine   Collection Time: 05/23/20 11:44 AM  Result Value Ref Range   Creatinine, Urine 109.02 mg/dL   Total Protein, Urine 48 mg/dL   Protein  Creatinine Ratio 0.44 (H) 0.00 - 0.15 mg/mg[Cre]    Patient Active Problem List   Diagnosis Date Noted  . Normal labor 05/23/2020  . Pruritus of pregnancy in third trimester 05/10/2020  . Low-lying placenta in second trimester 01/21/2020  . Group B streptococcal bacteriuria 11/29/2019  . History of herpes genitalis 01/13/2014  . Supervision of normal pregnancy 08/19/2013  . Fetal growth restriction in prior pregnancy, pregnant 08/19/2013    Assessment/Plan:  Cathey Fredenburg is a 29 y.o. W2O3785 at [redacted]w[redacted]d here for IOL for pre-eclampsia without severe features.  #Labor: Favorable cervix on admission. Placed foley bulb and plan to start oxytocin s/p adequate antibiotics for GBS positive status. #Pre-eclampsia w/o severe features. BPs elevated to 150s/90s with mild constant, frontal headaches without vision changes. Preeclampsia labs wnl except for UP:C of 0.44. Will continue to monitor Bps closely. #Pain: Per patient request, she is not currently planning on epidural. Desire for IV pain medications only on admission. #FWB:  Initial concern of prolonged decel around 1130 am, but since then is Cat 1 strip. #ID:  GBS on urine; PCN started on admission. #MOF: breast and bottle #MOC: postpartum BTL (consent signed in clinic 03/06/20) #Circ:  N/a #H/o HSV: Reports good adherence to valtrex. No visible lesions on exam at time of admission. #Low-lying placenta: resolved at growth ultrasound on 04/19/20  Jesusita Oka, MD  05/23/2020, 1:59 PM  Attestation of Supervision of Student:  I confirm that I have verified the information documented in the  resident's  note and that I have also personally reperformed the history, physical exam and all medical decision making activities.  I have verified that all services and findings are accurately documented in this student's note; and  I agree with management and plan as outlined in the documentation. I have also made any necessary editorial  changes.  Sheila Oats, MD [facility], [facility] 05/23/2020 2:33 PM

## 2020-05-23 NOTE — MAU Note (Signed)
Presents with c/o ctxs and possible LOF.  States been leaking since Saturday.  Denies VB.  Endorses +FM.

## 2020-05-24 ENCOUNTER — Encounter (HOSPITAL_COMMUNITY): Payer: Self-pay | Admitting: Obstetrics and Gynecology

## 2020-05-24 ENCOUNTER — Encounter (HOSPITAL_COMMUNITY)
Admission: AD | Disposition: A | Discharge status: Home / Self Care | Payer: Self-pay | Source: Home / Self Care | Attending: Family Medicine

## 2020-05-24 ENCOUNTER — Encounter: Payer: Medicaid Other | Admitting: Women's Health

## 2020-05-24 ENCOUNTER — Inpatient Hospital Stay (HOSPITAL_COMMUNITY): Payer: Medicaid Other | Admitting: Anesthesiology

## 2020-05-24 DIAGNOSIS — Z302 Encounter for sterilization: Secondary | ICD-10-CM | POA: Diagnosis not present

## 2020-05-24 DIAGNOSIS — O480 Post-term pregnancy: Secondary | ICD-10-CM | POA: Diagnosis present

## 2020-05-24 HISTORY — PX: TUBAL LIGATION: SHX77

## 2020-05-24 LAB — CBC
HCT: 29.9 % — ABNORMAL LOW (ref 36.0–46.0)
Hemoglobin: 10.4 g/dL — ABNORMAL LOW (ref 12.0–15.0)
MCH: 31.4 pg (ref 26.0–34.0)
MCHC: 34.8 g/dL (ref 30.0–36.0)
MCV: 90.3 fL (ref 80.0–100.0)
Platelets: 197 10*3/uL (ref 150–400)
RBC: 3.31 MIL/uL — ABNORMAL LOW (ref 3.87–5.11)
RDW: 14.9 % (ref 11.5–15.5)
WBC: 15.4 10*3/uL — ABNORMAL HIGH (ref 4.0–10.5)
nRBC: 0 % (ref 0.0–0.2)

## 2020-05-24 LAB — RPR: RPR Ser Ql: NONREACTIVE

## 2020-05-24 SURGERY — LIGATION, FALLOPIAN TUBE, POSTPARTUM
Anesthesia: Spinal | Laterality: Bilateral

## 2020-05-24 MED ORDER — SOD CITRATE-CITRIC ACID 500-334 MG/5ML PO SOLN: 30.0000 mL | ORAL | Status: DC | PRN

## 2020-05-24 MED ORDER — ONDANSETRON HCL 4 MG PO TABS: 4.0000 mg | ORAL_TABLET | ORAL | Status: DC | PRN

## 2020-05-24 MED ORDER — DIPHENHYDRAMINE HCL 25 MG PO CAPS: 25.0000 mg | ORAL_CAPSULE | Freq: Four times a day (QID) | ORAL | Status: DC | PRN

## 2020-05-24 MED ORDER — BENZOCAINE-MENTHOL 20-0.5 % EX AERO: 1.0000 "application " | INHALATION_SPRAY | CUTANEOUS | Status: DC | PRN

## 2020-05-24 MED ORDER — ZOLPIDEM TARTRATE 5 MG PO TABS: 5.0000 mg | ORAL_TABLET | Freq: Every evening | ORAL | Status: DC | PRN

## 2020-05-24 MED ORDER — ACETAMINOPHEN 325 MG PO TABS
650.0000 mg | ORAL_TABLET | ORAL | Status: DC | PRN
  Administered 2020-05-24 – 2020-05-25 (×2): 650 mg via ORAL
  Filled 2020-05-24 (×2): qty 2

## 2020-05-24 MED ORDER — SENNOSIDES-DOCUSATE SODIUM 8.6-50 MG PO TABS
2.0000 | ORAL_TABLET | ORAL | Status: DC
  Administered 2020-05-25: 2 via ORAL
  Filled 2020-05-24: qty 2

## 2020-05-24 MED ORDER — CHLOROPROCAINE HCL (PF) 3 % IJ SOLN
INTRAMUSCULAR | Status: AC
  Filled 2020-05-24: qty 20

## 2020-05-24 MED ORDER — FLEET ENEMA 7-19 GM/118ML RE ENEM: 1.0000 | ENEMA | RECTAL | Status: DC | PRN

## 2020-05-24 MED ORDER — CHLOROPROCAINE HCL (PF) 3 % IJ SOLN
INTRAMUSCULAR | Status: DC | PRN
  Administered 2020-05-24: 55 mg

## 2020-05-24 MED ORDER — MIDAZOLAM HCL 5 MG/5ML IJ SOLN
INTRAMUSCULAR | Status: DC | PRN
  Administered 2020-05-24 (×2): 1 mg via INTRAVENOUS

## 2020-05-24 MED ORDER — WITCH HAZEL-GLYCERIN EX PADS: 1.0000 "application " | MEDICATED_PAD | CUTANEOUS | Status: DC | PRN

## 2020-05-24 MED ORDER — BUPIVACAINE HCL (PF) 0.25 % IJ SOLN
INTRAMUSCULAR | Status: DC | PRN
  Administered 2020-05-24: 10 mL

## 2020-05-24 MED ORDER — MIDAZOLAM HCL 2 MG/2ML IJ SOLN
INTRAMUSCULAR | Status: AC
  Filled 2020-05-24: qty 2

## 2020-05-24 MED ORDER — SODIUM CHLORIDE 0.9 % IV SOLN: 250.0000 mL | INTRAVENOUS | Status: DC | PRN

## 2020-05-24 MED ORDER — LIDOCAINE HCL (PF) 1 % IJ SOLN: 30.0000 mL | INTRAMUSCULAR | Status: DC | PRN

## 2020-05-24 MED ORDER — FENTANYL CITRATE (PF) 100 MCG/2ML IJ SOLN
INTRAMUSCULAR | Status: DC | PRN
  Administered 2020-05-24: 15 ug via INTRATHECAL

## 2020-05-24 MED ORDER — SIMETHICONE 80 MG PO CHEW: 80.0000 mg | CHEWABLE_TABLET | ORAL | Status: DC | PRN

## 2020-05-24 MED ORDER — LACTATED RINGERS IV SOLN: 500.0000 mL | INTRAVENOUS | Status: DC | PRN

## 2020-05-24 MED ORDER — METOCLOPRAMIDE HCL 10 MG PO TABS
10.0000 mg | ORAL_TABLET | Freq: Once | ORAL | Status: AC
  Administered 2020-05-24: 10 mg via ORAL
  Filled 2020-05-24: qty 1

## 2020-05-24 MED ORDER — ONDANSETRON HCL 4 MG/2ML IJ SOLN: 4.0000 mg | INTRAMUSCULAR | Status: DC | PRN

## 2020-05-24 MED ORDER — MEASLES, MUMPS & RUBELLA VAC IJ SOLR: 0.5000 mL | Freq: Once | INTRAMUSCULAR | Status: DC

## 2020-05-24 MED ORDER — LACTATED RINGERS IV SOLN: INTRAVENOUS | Status: DC

## 2020-05-24 MED ORDER — FAMOTIDINE 20 MG PO TABS
40.0000 mg | ORAL_TABLET | Freq: Once | ORAL | Status: AC
  Administered 2020-05-24: 40 mg via ORAL
  Filled 2020-05-24: qty 2

## 2020-05-24 MED ORDER — ACETAMINOPHEN 325 MG PO TABS: 650.0000 mg | ORAL_TABLET | ORAL | Status: DC | PRN

## 2020-05-24 MED ORDER — OXYCODONE-ACETAMINOPHEN 5-325 MG PO TABS: 1.0000 | ORAL_TABLET | ORAL | Status: DC | PRN

## 2020-05-24 MED ORDER — IBUPROFEN 600 MG PO TABS
600.0000 mg | ORAL_TABLET | Freq: Four times a day (QID) | ORAL | Status: DC
  Administered 2020-05-24 (×2): 600 mg via ORAL
  Filled 2020-05-24 (×2): qty 1

## 2020-05-24 MED ORDER — HYDROXYZINE HCL 25 MG PO TABS: 50.0000 mg | ORAL_TABLET | Freq: Four times a day (QID) | ORAL | Status: DC | PRN

## 2020-05-24 MED ORDER — OXYTOCIN-SODIUM CHLORIDE 30-0.9 UT/500ML-% IV SOLN: 2.5000 [IU]/h | INTRAVENOUS | Status: DC

## 2020-05-24 MED ORDER — PROMETHAZINE HCL 25 MG/ML IJ SOLN: 6.2500 mg | INTRAMUSCULAR | Status: DC | PRN

## 2020-05-24 MED ORDER — LACTATED RINGERS IV SOLN: INTRAVENOUS | Status: DC | PRN

## 2020-05-24 MED ORDER — OXYCODONE HCL 5 MG PO TABS: 5.0000 mg | ORAL_TABLET | Freq: Once | ORAL | Status: DC | PRN

## 2020-05-24 MED ORDER — ONDANSETRON HCL 4 MG/2ML IJ SOLN
INTRAMUSCULAR | Status: AC
  Filled 2020-05-24: qty 2

## 2020-05-24 MED ORDER — OXYTOCIN BOLUS FROM INFUSION: 333.0000 mL | Freq: Once | INTRAVENOUS | Status: DC

## 2020-05-24 MED ORDER — ONDANSETRON HCL 4 MG/2ML IJ SOLN
INTRAMUSCULAR | Status: DC | PRN
  Administered 2020-05-24: 4 mg via INTRAVENOUS

## 2020-05-24 MED ORDER — IBUPROFEN 600 MG PO TABS
600.0000 mg | ORAL_TABLET | Freq: Four times a day (QID) | ORAL | Status: DC
  Administered 2020-05-24 – 2020-05-25 (×3): 600 mg via ORAL
  Filled 2020-05-24 (×3): qty 1

## 2020-05-24 MED ORDER — FENTANYL CITRATE (PF) 100 MCG/2ML IJ SOLN: 50.0000 ug | INTRAMUSCULAR | Status: DC | PRN

## 2020-05-24 MED ORDER — ONDANSETRON HCL 4 MG/2ML IJ SOLN: 4.0000 mg | Freq: Four times a day (QID) | INTRAMUSCULAR | Status: DC | PRN

## 2020-05-24 MED ORDER — SENNOSIDES-DOCUSATE SODIUM 8.6-50 MG PO TABS: 2.0000 | ORAL_TABLET | ORAL | Status: DC

## 2020-05-24 MED ORDER — KETOROLAC TROMETHAMINE 30 MG/ML IJ SOLN: 30.0000 mg | Freq: Once | INTRAMUSCULAR | Status: DC | PRN

## 2020-05-24 MED ORDER — PRENATAL MULTIVITAMIN CH
1.0000 | ORAL_TABLET | Freq: Every day | ORAL | Status: DC
  Administered 2020-05-24: 1 via ORAL
  Filled 2020-05-24: qty 1

## 2020-05-24 MED ORDER — FENTANYL CITRATE (PF) 100 MCG/2ML IJ SOLN
INTRAMUSCULAR | Status: AC
  Filled 2020-05-24: qty 2

## 2020-05-24 MED ORDER — TERBUTALINE SULFATE 1 MG/ML IJ SOLN
0.2500 mg | Freq: Once | INTRAMUSCULAR | Status: DC | PRN
  Filled 2020-05-24: qty 1

## 2020-05-24 MED ORDER — FLEET ENEMA 7-19 GM/118ML RE ENEM: 1.0000 | ENEMA | Freq: Every day | RECTAL | Status: DC | PRN

## 2020-05-24 MED ORDER — MISOPROSTOL 100 MCG PO TABS: 25.0000 ug | ORAL_TABLET | ORAL | Status: DC | PRN

## 2020-05-24 MED ORDER — OXYCODONE HCL 5 MG/5ML PO SOLN: 5.0000 mg | Freq: Once | ORAL | Status: DC | PRN

## 2020-05-24 MED ORDER — MEPERIDINE HCL 25 MG/ML IJ SOLN: 6.2500 mg | INTRAMUSCULAR | Status: DC | PRN

## 2020-05-24 MED ORDER — BISACODYL 10 MG RE SUPP: 10.0000 mg | Freq: Every day | RECTAL | Status: DC | PRN

## 2020-05-24 MED ORDER — PRENATAL MULTIVITAMIN CH
1.0000 | ORAL_TABLET | Freq: Every day | ORAL | Status: DC
  Administered 2020-05-25: 1 via ORAL
  Filled 2020-05-24: qty 1

## 2020-05-24 MED ORDER — DIBUCAINE (PERIANAL) 1 % EX OINT: 1.0000 "application " | TOPICAL_OINTMENT | CUTANEOUS | Status: DC | PRN

## 2020-05-24 MED ORDER — OXYCODONE-ACETAMINOPHEN 5-325 MG PO TABS: 2.0000 | ORAL_TABLET | ORAL | Status: DC | PRN

## 2020-05-24 MED ORDER — COCONUT OIL OIL: 1.0000 "application " | TOPICAL_OIL | Status: DC | PRN

## 2020-05-24 MED ORDER — TETANUS-DIPHTH-ACELL PERTUSSIS 5-2.5-18.5 LF-MCG/0.5 IM SUSP: 0.5000 mL | Freq: Once | INTRAMUSCULAR | Status: DC

## 2020-05-24 MED ORDER — SODIUM CHLORIDE 0.9% FLUSH: 3.0000 mL | INTRAVENOUS | Status: DC | PRN

## 2020-05-24 MED ORDER — BUPIVACAINE HCL (PF) 0.25 % IJ SOLN
INTRAMUSCULAR | Status: AC
  Filled 2020-05-24: qty 30

## 2020-05-24 MED ORDER — HYDROMORPHONE HCL 1 MG/ML IJ SOLN: 0.2500 mg | INTRAMUSCULAR | Status: DC | PRN

## 2020-05-24 MED ORDER — SODIUM CHLORIDE 0.9% FLUSH
3.0000 mL | Freq: Two times a day (BID) | INTRAVENOUS | Status: DC
  Administered 2020-05-24: 3 mL via INTRAVENOUS

## 2020-05-24 SURGICAL SUPPLY — 21 items
BLADE SURG 11 STRL SS (BLADE) ×3 IMPLANT
DRSG OPSITE POSTOP 3X4 (GAUZE/BANDAGES/DRESSINGS) ×3 IMPLANT
DURAPREP 26ML APPLICATOR (WOUND CARE) ×3 IMPLANT
GLOVE BIOGEL PI IND STRL 7.0 (GLOVE) ×1 IMPLANT
GLOVE BIOGEL PI IND STRL 7.5 (GLOVE) ×1 IMPLANT
GLOVE BIOGEL PI INDICATOR 7.0 (GLOVE) ×2
GLOVE BIOGEL PI INDICATOR 7.5 (GLOVE) ×2
GLOVE ECLIPSE 7.5 STRL STRAW (GLOVE) ×3 IMPLANT
GOWN STRL REUS W/TWL LRG LVL3 (GOWN DISPOSABLE) ×6 IMPLANT
NEEDLE HYPO 22GX1.5 SAFETY (NEEDLE) ×3 IMPLANT
NS IRRIG 1000ML POUR BTL (IV SOLUTION) ×3 IMPLANT
PACK ABDOMINAL MINOR (CUSTOM PROCEDURE TRAY) ×3 IMPLANT
PROTECTOR NERVE ULNAR (MISCELLANEOUS) ×3 IMPLANT
SPONGE LAP 18X18 RF (DISPOSABLE) ×3 IMPLANT
SPONGE LAP 4X18 RFD (DISPOSABLE) IMPLANT
SUT PLAIN 0 NONE (SUTURE) IMPLANT
SUT VICRYL 0 UR6 27IN ABS (SUTURE) ×3 IMPLANT
SUT VICRYL 4-0 PS2 18IN ABS (SUTURE) ×3 IMPLANT
SYR CONTROL 10ML LL (SYRINGE) ×3 IMPLANT
TOWEL OR 17X24 6PK STRL BLUE (TOWEL DISPOSABLE) ×6 IMPLANT
TRAY FOLEY W/BAG SLVR 14FR (SET/KITS/TRAYS/PACK) ×3 IMPLANT

## 2020-05-24 NOTE — Anesthesia Procedure Notes (Signed)
Spinal  Patient location during procedure: OR Start time: 05/24/2020 2:15 PM End time: 05/24/2020 2:18 PM Staffing Performed: anesthesiologist  Anesthesiologist: Lannie Fields, DO Preanesthetic Checklist Completed: patient identified, IV checked, risks and benefits discussed, surgical consent, monitors and equipment checked, pre-op evaluation and timeout performed Spinal Block Patient position: sitting Prep: DuraPrep and site prepped and draped Patient monitoring: cardiac monitor, continuous pulse ox and blood pressure Approach: midline Location: L3-4 Injection technique: single-shot Needle Needle type: Pencan  Needle gauge: 24 G Needle length: 9 cm Assessment Sensory level: T6 Additional Notes Functioning IV was confirmed and monitors were applied. Sterile prep and drape, including hand hygiene and sterile gloves were used. The patient was positioned and the spine was prepped. The skin was anesthetized with lidocaine.  Free flow of clear CSF was obtained prior to injecting local anesthetic into the CSF.  The spinal needle aspirated freely following injection.  The needle was carefully withdrawn.  The patient tolerated the procedure well.

## 2020-05-24 NOTE — Anesthesia Postprocedure Evaluation (Signed)
Anesthesia Post Note  Patient: Museum/gallery exhibitions officer  Procedure(s) Performed: POST PARTUM TUBAL LIGATION (Bilateral )     Patient location during evaluation: PACU Anesthesia Type: Spinal Level of consciousness: awake and alert and oriented Pain management: pain level controlled Vital Signs Assessment: post-procedure vital signs reviewed and stable Respiratory status: spontaneous breathing, nonlabored ventilation and respiratory function stable Cardiovascular status: blood pressure returned to baseline and stable Postop Assessment: no headache, no backache, spinal receding and patient able to bend at knees Anesthetic complications: no   No complications documented.  Last Vitals:  Vitals:   05/24/20 1545 05/24/20 1600  BP: 140/87 (!) 154/85  Pulse: (!) 56 (!) 57  Resp: 11 12  Temp:    SpO2: 100% 100%    Last Pain:  Vitals:   05/24/20 1545  TempSrc:   PainSc: 0-No pain   Pain Goal:    LLE Motor Response: Purposeful movement (05/24/20 1600)   RLE Motor Response: Purposeful movement (05/24/20 1600)       Epidural/Spinal Function Cutaneous sensation: Able to Wiggle Toes (05/24/20 1600), Patient able to flex knees: Yes (05/24/20 1600), Patient able to lift hips off bed: No (05/24/20 1600), Back pain beyond tenderness at insertion site: No (05/24/20 1600), Progressively worsening motor and/or sensory loss: No (05/24/20 1600), Bowel and/or bladder incontinence post epidural: No (05/24/20 1600)  Lannie Fields

## 2020-05-24 NOTE — Anesthesia Preprocedure Evaluation (Addendum)
Anesthesia Evaluation  Patient identified by MRN, date of birth, ID band Patient awake    Reviewed: Allergy & Precautions, NPO status , Patient's Chart, lab work & pertinent test results  Airway Mallampati: II  TM Distance: >3 FB Neck ROM: Full    Dental no notable dental hx.    Pulmonary neg pulmonary ROS,    Pulmonary exam normal breath sounds clear to auscultation       Cardiovascular negative cardio ROS Normal cardiovascular exam Rhythm:Regular Rate:Normal     Neuro/Psych negative neurological ROS  negative psych ROS   GI/Hepatic Neg liver ROS, GERD  Medicated and Controlled,  Endo/Other    Renal/GU negative Renal ROS  negative genitourinary   Musculoskeletal negative musculoskeletal ROS (+)   Abdominal (+) + obese,   Peds  Hematology  (+) Blood dyscrasia, anemia , hct 29.9, plt 197   Anesthesia Other Findings   Reproductive/Obstetrics G3P3 delivered 10/19 2244 IOL for preE without SF, no epidural  Desires sterility                             Anesthesia Physical Anesthesia Plan  ASA: II  Anesthesia Plan: Spinal   Post-op Pain Management:    Induction:   PONV Risk Score and Plan: 3 and Ondansetron, Dexamethasone and Treatment may vary due to age or medical condition  Airway Management Planned: Natural Airway and Nasal Cannula  Additional Equipment: None  Intra-op Plan:   Post-operative Plan:   Informed Consent: I have reviewed the patients History and Physical, chart, labs and discussed the procedure including the risks, benefits and alternatives for the proposed anesthesia with the patient or authorized representative who has indicated his/her understanding and acceptance.       Plan Discussed with: CRNA  Anesthesia Plan Comments:        Anesthesia Quick Evaluation

## 2020-05-24 NOTE — Lactation Note (Signed)
This note was copied from a baby's chart. Lactation Consultation Note  Patient Name: Veronica Shea DTOIZ'T Date: 05/24/2020 Reason for consult: Initial assessment;Term  Consult was done in [place].  Initial visit to 12 hours old with 0.32% weight loss of a P3 mother with breastfeeding experience. Infant is sleeping in mother's arms upon arrival. Mother states breastfeeding is going well but she feels baby is not getting enough. Mother explains she started bottle-feeding formula as a supplement. Last feeding baby breastfed and then bottlefed 20 mL of formula.    Reviewed with mother average size of a NB stomach and formula supplementation guidelines. Discussed pace bottle feeding benefits and demonstrated technique to prevent emesis. Encourage to follow babies' hunger and fullness cues. Reviewed importance to offer the breast 8 to 12 times in a 24-hour period for proper stimulation and to establish good milk supply. Reviewed signs of good milk transfer. Discussed milk coming to volume. Promoted maternal rest, hydration and food intake. Reviewed newborn behavior and expectations with mother and encouraged to contact [facility] for support, questions or concerns.    All questions answered at this time.   Maternal Data Formula Feeding for Exclusion: Yes Reason for exclusion: Mother's choice to formula and breast feed on admission Has patient been taught Hand Expression?: No Does the patient have breastfeeding experience prior to this delivery?: Yes  Feeding Feeding Type: Formula  Interventions Interventions: Breast feeding basics reviewed;Expressed milk  Lactation Tools Discussed/Used WIC Program: Yes   Consult Status Consult Status: Follow-up Date: 05/25/20 Follow-up type: In-patient    Hillman Attig A Higuera Ancidey 05/24/2020, 11:09 AM

## 2020-05-24 NOTE — Progress Notes (Signed)
Patient desires permanent sterilization.  Other reversible forms of contraception were discussed with patient; she declines all other modalities. Risks of procedure discussed with patient including but not limited to: risk of regret, permanence of method, bleeding, infection, injury to surrounding organs and need for additional procedures.  Failure risk of 1-2 % with increased risk of ectopic gestation if pregnancy occurs was also discussed with patient.  Patient verbalized understanding of these risks and wants to proceed with sterilization.  Written informed consent obtained.  To OR when ready.  

## 2020-05-24 NOTE — Progress Notes (Signed)
Post Partum Day 1 Subjective: no complaints  Objective: Blood pressure 128/77, pulse 68, temperature 98 F (36.7 C), resp. rate 17, height 4\' 9"  (1.448 m), weight 68 kg, last menstrual period 08/17/2019, SpO2 99 %, unknown if currently breastfeeding.  Physical Exam:  General: alert, cooperative and no distress Lochia: appropriate Uterine Fundus: firm Incision: n/a DVT Evaluation: No evidence of DVT seen on physical exam.  Recent Labs    05/23/20 1120 05/24/20 0442  HGB 12.3 10.4*  HCT 37.1 29.9*    Assessment/Plan: Plan for discharge tomorrow and Contraception BTL today at 1400   LOS: 1 day   05/26/20 05/24/2020, 11:30 AM

## 2020-05-24 NOTE — Transfer of Care (Signed)
Immediate Anesthesia Transfer of Care Note  Patient: Veronica Shea  Procedure(s) Performed: POST PARTUM TUBAL LIGATION (Bilateral )  Patient Location: PACU  Anesthesia Type:Spinal  Level of Consciousness: oriented and sedated  Airway & Oxygen Therapy: Patient Spontanous Breathing  Post-op Assessment: Report given to RN and Post -op Vital signs reviewed and stable  Post vital signs: Reviewed and stable  Last Vitals:  Vitals Value Taken Time  BP    Temp    Pulse    Resp    SpO2      Last Pain:  Vitals:   05/24/20 1354  TempSrc: Oral  PainSc:          Complications: No complications documented.

## 2020-05-24 NOTE — Op Note (Signed)
NAME@ 05/24/2020  PREOPERATIVE DIAGNOSIS:  Undesired fertility  POSTOPERATIVE DIAGNOSIS:  Undesired fertility  PROCEDURE:  Postpartum Bilateral Tubal Sterilization using Pomeroy method   ANESTHESIA:  Epidural  COMPLICATIONS:  None immediate.  ESTIMATED BLOOD LOSS:  Less than 20cc.  FLUIDS: 1200 cc LR.  URINE OUTPUT:  75 cc of clear urine.  INDICATIONS: 29 y.o. yo D7O2423  with undesired fertility,status post vaginal delivery, desires permanent sterilization. Risks and benefits of procedure discussed with patient including permanence of method, bleeding, infection, injury to surrounding organs and need for additional procedures. Risk failure of 0.5-1% with increased risk of ectopic gestation if pregnancy occurs was also discussed with patient.   FINDINGS:  Normal uterus, tubes, and ovaries.  TECHNIQUE: After informed consent was obtained, the patient was taken to the operating room where anesthesia was induced and found to be adequate. A small transverse, infraumbilical skin incision was made with the scalpel. This incision was carried down to the underlying layer of fascia. The fascia was grasped with Kocher clamps tented up and entered sharply with Mayo scissors. Underlying peritoneum was then identified tented up and entered sharply with Metzenbaum scissors.  The patient's left fallopian tube was then identified, brought to the incision, and grasped with a Babcock clamp. The tube was then followed out to the fimbria. The Babcock clamp was then used to grasp the tube approximately 4 cm from the cornual region. A 3 cm segment of the tube was then ligated with free tie of plain gut suture, transected and excised. Good hemostasis was noted and the tube was returned to the abdomen. The right fallopian tube was then identified to its fimbriated end, ligated, and a 3 cm segment excised in a similar fashion. Excellent hemostasis was noted, and the tube returned to the abdomen. The fascia was  re-approximated with 0 Vicryl. The skin was closed in a subcuticular fashion with 3-0 Vicryl. Quarter percent Marcaine solution was then injected at the incision site. The patient tolerated the procedure well. Sponge, lap, and needle count were correct x2. The patient was taken to recovery room in stable condition.

## 2020-05-25 ENCOUNTER — Other Ambulatory Visit (HOSPITAL_COMMUNITY): Payer: Self-pay | Admitting: Family Medicine

## 2020-05-25 DIAGNOSIS — O165 Unspecified maternal hypertension, complicating the puerperium: Secondary | ICD-10-CM

## 2020-05-25 MED ORDER — AMLODIPINE BESYLATE 5 MG PO TABS
5.0000 mg | ORAL_TABLET | Freq: Every day | ORAL | 1 refills | Status: DC
Start: 2020-05-25 — End: 2020-05-25

## 2020-05-25 MED ORDER — ACETAMINOPHEN 325 MG PO TABS
650.0000 mg | ORAL_TABLET | ORAL | Status: DC | PRN
Start: 2020-05-25 — End: 2021-08-16

## 2020-05-25 MED ORDER — IBUPROFEN 600 MG PO TABS
600.0000 mg | ORAL_TABLET | Freq: Four times a day (QID) | ORAL | 0 refills | Status: DC
Start: 2020-05-25 — End: 2020-06-28

## 2020-05-25 MED ORDER — AMLODIPINE BESYLATE 5 MG PO TABS
5.0000 mg | ORAL_TABLET | Freq: Every day | ORAL | Status: DC
  Administered 2020-05-25: 5 mg via ORAL
  Filled 2020-05-25: qty 1

## 2020-05-25 MED FILL — AMLODIPINE BESYLATE 5 MG TA: 5 | 30 days supply | Qty: 30 | Fill #0

## 2020-05-25 NOTE — Discharge Instructions (Signed)

## 2020-05-25 NOTE — Lactation Note (Signed)
This note was copied from a baby's chart. Lactation Consultation Note  Patient Name: Veronica Shea WYOVZ'C Date: 05/25/2020 Reason for consult: Follow-up assessment  Follow up visit to 37 hours old with 2.38% weight loss of a P3 mother with breastfeeding experience. Baby is sleeping in mother's arms upon arrival. Mother states breastfeeding is going well but infant is not getting full. Mother continues to bottle-feeding ~18mL of formula to supplement. Last feeding baby breastfed for 15-20 minutes. Infant has been been having good voids and stools, per mother.  Feeding plan:  1. Breastfeed following hunger cues.  2. Stimulate infant awake at the breast 3. Offer breast 8 -  12 times in 24h period to establish good milk supply.   4. If needed supplement with formula following guidelines, pace bottle feeding and fullness cues.   5. Encouraged maternal rest, hydration and food intake.  6. Contact Lactation Services or local resources for support, questions or concerns.    All questions answered at this time. Family is waiting to be discharged home today.   Consult was done in [place].   Maternal Data Reason for exclusion: Mother's choice to formula and breast feed on admission Has patient been taught Hand Expression?: Yes Does the patient have breastfeeding experience prior to this delivery?: Yes  Feeding Feeding Type: Breast Fed  Interventions Interventions: Breast feeding basics reviewed  Lactation Tools Discussed/Used WIC Program: Yes   Consult Status Consult Status: Complete Date: 05/25/20 Follow-up type: Call as needed    Tiburcio Linder A Higuera Ancidey 05/25/2020, 12:28 PM

## 2020-05-25 NOTE — Progress Notes (Signed)
POSTPARTUM PROGRESS NOTE  Subjective: Veronica Shea is a 29 y.o. W5Y0998 s/p SVD at [redacted]w[redacted]d (PPD#2), POD#1 from BTL.  She reports she doing well. No acute events overnight. She denies any problems with ambulating, voiding or po intake. Denies nausea or vomiting. She has passed flatus. Pain is well controlled.  Lochia is decreasing and without large clots.  Objective: Blood pressure 130/87, pulse 70, temperature 98 F (36.7 C), resp. rate 17, height 4\' 9"  (1.448 m), weight 68 kg, last menstrual period 08/17/2019, SpO2 98 %, unknown if currently breastfeeding.  Physical Exam:  General: alert, cooperative and no distress Chest: no respiratory distress, normal work of breathing Abdomen: soft, non-tender  Uterine Fundus: firm and at level of umbilicus Extremities: No calf swelling or tenderness  no edema  Recent Labs    05/23/20 1120 05/24/20 0442  HGB 12.3 10.4*  HCT 37.1 29.9*    Assessment/Plan: Veronica Shea is a 29 y.o. 26 s/p SVD at [redacted]w[redacted]d (PPD#2), POD#1 from BTL.   Routine Postpartum Care: Doing well, pain well-controlled.  -- Continue routine care, lactation support  -- Contraception: BTL -- Feeding: breast and bottle  Dispo: Plan for discharge tomorrow, 05/26/2020.  05/28/2020, MD 05/25/2020 8:25 AM

## 2020-05-26 ENCOUNTER — Telehealth: Payer: Self-pay

## 2020-05-26 LAB — SURGICAL PATHOLOGY

## 2020-05-26 NOTE — Telephone Encounter (Signed)
Transition Care Management Follow-up Telephone Call  Date of discharge and from where: 05/25/2020 from [facility]  How have you been since you were released from the hospital? Pt states that she is doing well and has all resources that she needs at this time.  Any questions or concerns? No  Items Reviewed:  Did the pt receive and understand the discharge instructions provided? Yes   Medications obtained and verified? Yes   Other? No   Any new allergies since your discharge? No   Dietary orders reviewed? N/A  Do you have support at home? Yes   Functional Questionnaire: (I = Independent and D = Dependent) ADLs: I  Bathing/Dressing- I  Meal Prep- I  Eating- I  Maintaining continence- I  Transferring/Ambulation- I  Managing Meds- I   Follow up appointments reviewed:   PCP Hospital f/u appt confirmed? No PCP  Are transportation arrangements needed? No   If their condition worsens, is the pt aware to call PCP or go to the Emergency Dept.? Yes  Was the patient provided with contact information for the PCP's office or ED? Yes  Was to pt encouraged to call back with questions or concerns? Yes

## 2020-05-29 ENCOUNTER — Other Ambulatory Visit (HOSPITAL_COMMUNITY): Payer: Medicaid Other

## 2020-05-30 ENCOUNTER — Telehealth: Payer: Self-pay

## 2020-05-30 ENCOUNTER — Inpatient Hospital Stay (HOSPITAL_COMMUNITY): Payer: Medicaid Other

## 2020-05-30 ENCOUNTER — Inpatient Hospital Stay (HOSPITAL_COMMUNITY)
Admission: AD | Admit: 2020-05-30 | Payer: Medicaid Other | Source: Home / Self Care | Admitting: Obstetrics & Gynecology

## 2020-05-30 NOTE — Telephone Encounter (Signed)
Elevated BP alert received from [employer] of 139/92. Called pt; VM left stating I am calling to follow up with pt and to please call the office. Callback number given. MyChart message sent. Per DC summary pt is to be scheduled for BP check in the office this week.

## 2020-06-01 ENCOUNTER — Other Ambulatory Visit: Payer: Self-pay | Admitting: Student

## 2020-06-01 DIAGNOSIS — O26893 Other specified pregnancy related conditions, third trimester: Secondary | ICD-10-CM

## 2020-06-08 ENCOUNTER — Ambulatory Visit (INDEPENDENT_AMBULATORY_CARE_PROVIDER_SITE_OTHER): Payer: Medicaid Other

## 2020-06-08 ENCOUNTER — Other Ambulatory Visit: Payer: Self-pay

## 2020-06-08 VITALS — BP 127/86 | HR 70 | Wt 131.6 lb

## 2020-06-08 DIAGNOSIS — Z0130 Encounter for examination of blood pressure without abnormal findings: Secondary | ICD-10-CM

## 2020-06-08 NOTE — Progress Notes (Signed)
Pt here today for BP check.  Pt reports mild headaches that go away with Tylenol or Ibuprofen.  Pt denies visual disturbances.  Pt reports taking Norvasc 5 mg po tablet around noon.  Last dose was yesterday at noon.  BP LA 127/86.  Pt advised to continue taking BP medication, monitor for sx's of HTN, and to call the office with concerns.  Pt reports that she still has a dressing on her incision.  Observed pt with the honeycomb dressing still placed, removed dressing, incision looks well- no odor, no drainage, no swelling, and no erythema.  Pt advised to allow warm soapy water to run over it and pat dry.  Pt verbalized understanding with no further questions.   Addison Naegeli, RN  06/08/20

## 2020-06-22 ENCOUNTER — Ambulatory Visit (INDEPENDENT_AMBULATORY_CARE_PROVIDER_SITE_OTHER): Payer: Medicaid Other | Admitting: General Practice

## 2020-06-22 ENCOUNTER — Other Ambulatory Visit: Payer: Self-pay

## 2020-06-22 VITALS — BP 111/55 | HR 55 | Temp 97.8°F | Ht <= 58 in | Wt 126.0 lb

## 2020-06-22 DIAGNOSIS — Z5189 Encounter for other specified aftercare: Secondary | ICD-10-CM

## 2020-06-22 MED ORDER — CEPHALEXIN 250 MG PO CAPS: 250.0000 mg | ORAL_CAPSULE | Freq: Three times a day (TID) | ORAL | 0 refills | Status: AC

## 2020-06-22 NOTE — Progress Notes (Signed)
Patient presents to office today with concern of wound infection. Patient states she noticed Tuesday night green pus coming out from her incision- denies pain. She reports feeling cold all day yesterday like she could never get warm and she woke up several times last night drenched in sweat and had to change clothes. Per chart review, patient had vaginal delivery on 10/19 & BTL. <1 cm small reddened area on bottom right of incision. Patient is not tender upon palpation. Dr Mayford Knife assessed incision and ordered Keflex 250mg  TID x 5 days for infection prevention. Reviewed with patient & Rx sent to pharmacy. Patient verbalized understanding & had no questions. Patient will follow up at pp visit on 11/24.  12/24 RN BSN 06/22/20

## 2020-06-28 ENCOUNTER — Ambulatory Visit (INDEPENDENT_AMBULATORY_CARE_PROVIDER_SITE_OTHER): Payer: Medicaid Other | Admitting: Family Medicine

## 2020-06-28 ENCOUNTER — Encounter: Payer: Self-pay | Admitting: Family Medicine

## 2020-06-28 ENCOUNTER — Other Ambulatory Visit: Payer: Self-pay

## 2020-06-28 DIAGNOSIS — Z5189 Encounter for other specified aftercare: Secondary | ICD-10-CM

## 2020-06-28 DIAGNOSIS — Z9851 Tubal ligation status: Secondary | ICD-10-CM

## 2020-06-28 NOTE — Progress Notes (Signed)
Post Partum Visit Note  Veronica Shea is a 29 y.o. [id number] female who presents for a postpartum visit. She is 5 weeks postpartum following a normal spontaneous vaginal delivery.  I have fully reviewed the prenatal and intrapartum course. The delivery was at 40 gestational weeks.  Anesthesia: local. Postpartum course has been complicated by bP elevation. Baby is doing well - she is having low milk supply. Baby is feeding by both breast and bottle - Gerber Gentle. Bleeding staining only. Bowel function is normal. Bladder function is normal. Patient is not sexually active. Contraception method is tubal ligation. Postpartum depression screening: negative.   The pregnancy intention screening data noted above was reviewed. Potential methods of contraception were discussed. The patient elected to proceed with Female Sterilization.    Edinburgh Postnatal Depression Scale - 06/28/20 1425      Edinburgh Postnatal Depression Scale:  In the Past 7 Days   I have been able to laugh and see the funny side of things. 0    I have looked forward with enjoyment to things. 0    I have blamed myself unnecessarily when things went wrong. 0    I have been anxious or worried for no good reason. 0    I have felt scared or panicky for no good reason. 0    Things have been getting on top of me. 0    I have been so unhappy that I have had difficulty sleeping. 0    I have felt sad or miserable. 0    I have been so unhappy that I have been crying. 0    The thought of harming myself has occurred to me. 0    Edinburgh Postnatal Depression Scale Total 0            The following portions of the patient's history were reviewed and updated as appropriate: allergies, current medications, past family history, past medical history, past social history, past surgical history and problem list.  Review of Systems Pertinent items are noted in HPI.    Objective:  BP 124/83   Pulse 81   Ht 4\' 10"  (1.473 m)    Wt 122 lb (55.3 kg)   Breastfeeding Yes   BMI 25.50 kg/m    General:  alert, cooperative and appears stated age   Breasts:  negative  Lungs: clear to auscultation bilaterally  Heart:  regular rate and rhythm, S1, S2 normal, no murmur, click, rub or gallop  Abdomen: soft, non-tender; bowel sounds normal; no masses,  no organomegaly. Healing scar around umbilicus. Weeping granulation tissue on right edge, no pus. Slightly tender to probing. Silver nitrate applied.    Vulva:  not evaluated  Vagina: not evaluated  Cervix:  not evaluated  Corpus: not examined  Adnexa:  not evaluated  Rectal Exam: Not performed.        Assessment:   Normal postpartum exam. Pap smear not done at today's visit.   Plan:   Essential components of care per ACOG recommendations:  1.  Mood and well being: Patient with negative depression screening today. Reviewed local resources for support.  - Patient does not use tobacco. - hx of drug use? No    2. Infant care and feeding:  -Patient currently breastmilk feeding? No  -Social determinants of health (SDOH) reviewed in EPIC. No concerns  3. Sexuality, contraception and birth spacing - Patient does not want a pregnancy in the next year.  Desired family size is 3 children.  -  Reviewed forms of contraception in tiered fashion. Patient desired bilateral tubal ligation today.   - Discussed birth spacing of 18 months  4. Sleep and fatigue -Encouraged family/partner/community support of 4 hrs of uninterrupted sleep to help with mood and fatigue  5. Physical Recovery  - Discussed patients delivery and complications - Perineal healing reviewed. Patient expressed understanding - Patient has urinary incontinence? No  - Patient is safe to resume physical and sexual activity  6.  Health Maintenance - Last pap smear done 04/2019 and was normal with negative. Performed at [facility]. Encouraged regular pap follow up. Might need BCCCP paps given now has definitive family  planning method.  Mammogram@50   7. Chronic Disease - BP was WNL today - PCP follow up  Federico Flake, MD [facility]

## 2020-07-03 ENCOUNTER — Encounter: Payer: Self-pay | Admitting: Family Medicine

## 2020-07-11 ENCOUNTER — Encounter: Payer: Self-pay | Admitting: General Practice

## 2021-06-20 IMAGING — US US MFM OB FOLLOW-UP
1 series · 14 of 28 positions shown · non-contrast
Comparison: none

[Series 1: us mfm ob follow-up · 46 acquisitions, 14 frames shown]
[im 2/46]
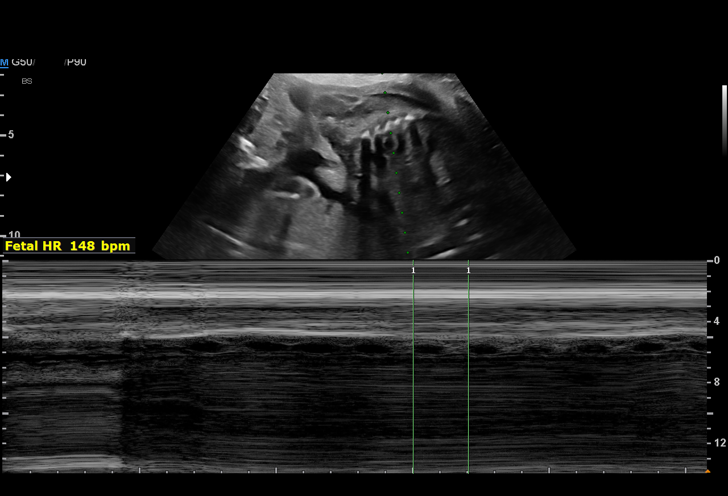
[im 6/46]
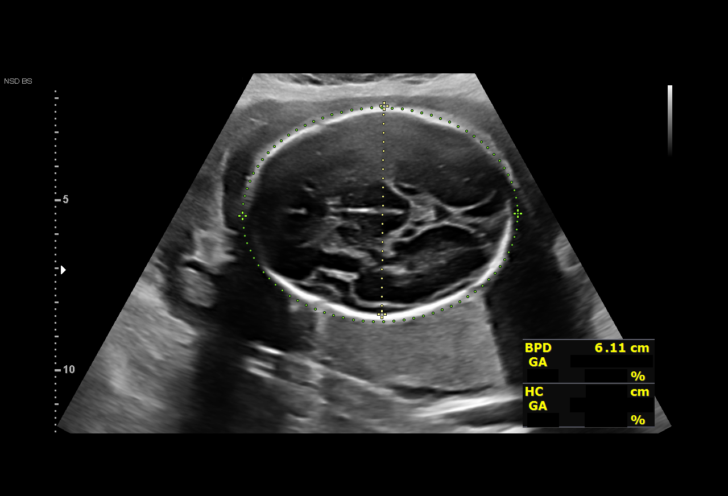
[im 9/46]
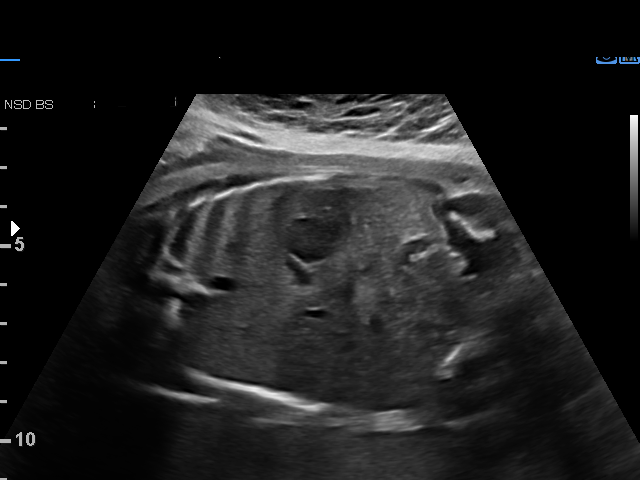
[im 12/46]
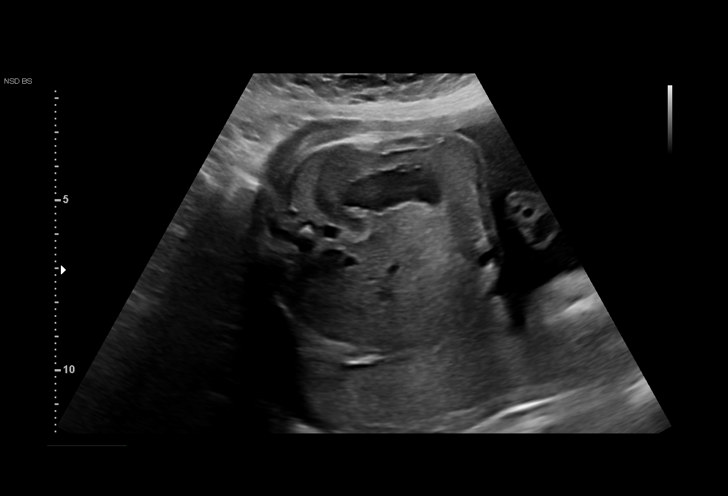
[im 16/46]
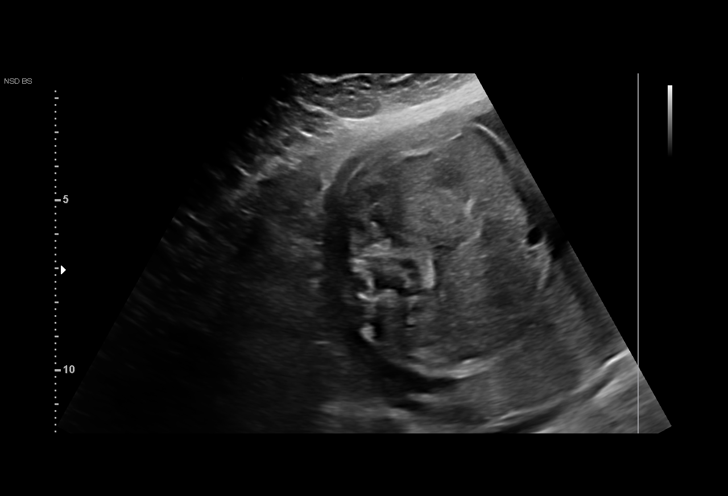
[im 19/46]
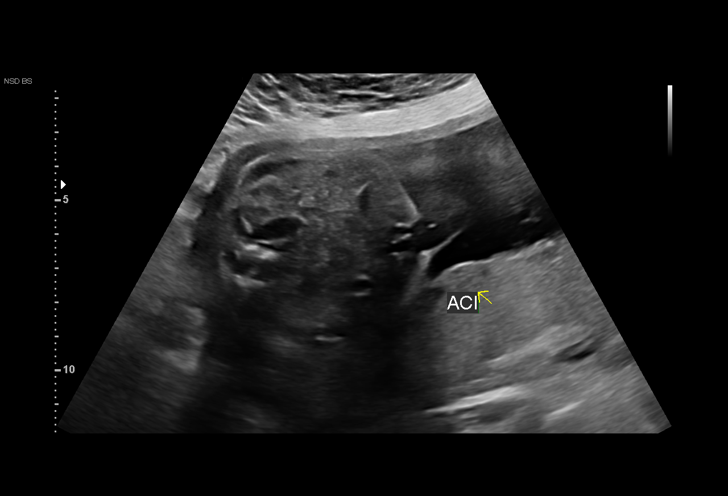
[im 22/46]
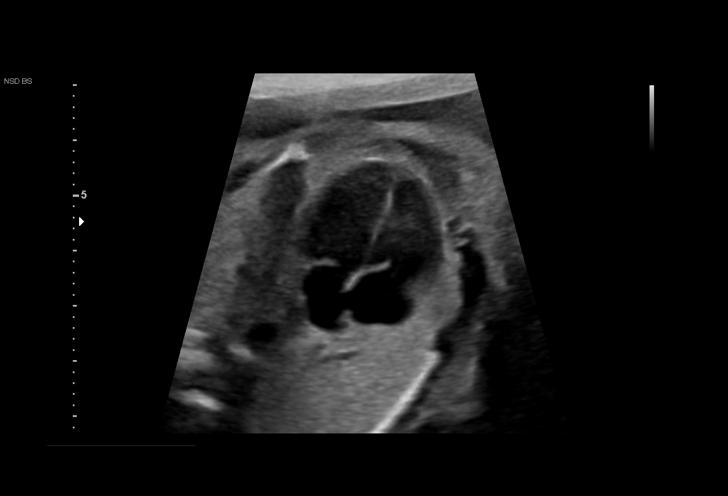
[im 26/46]
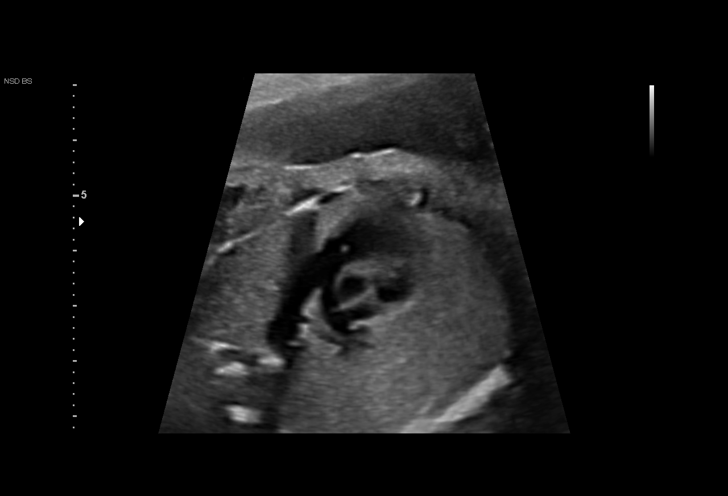
[im 29/46]
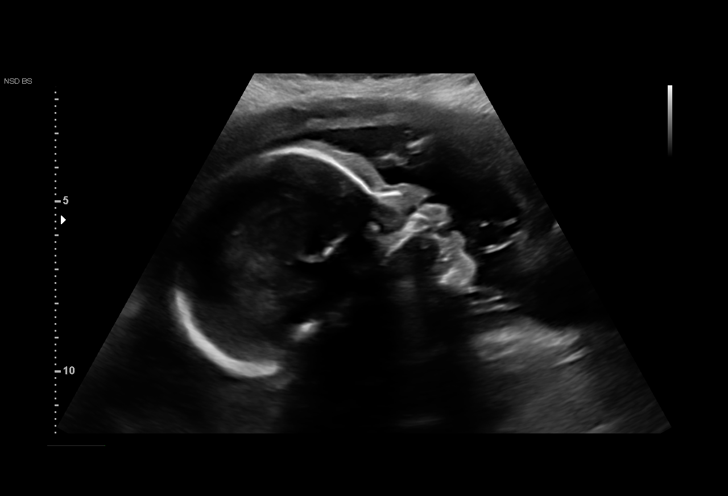
[im 32/46]
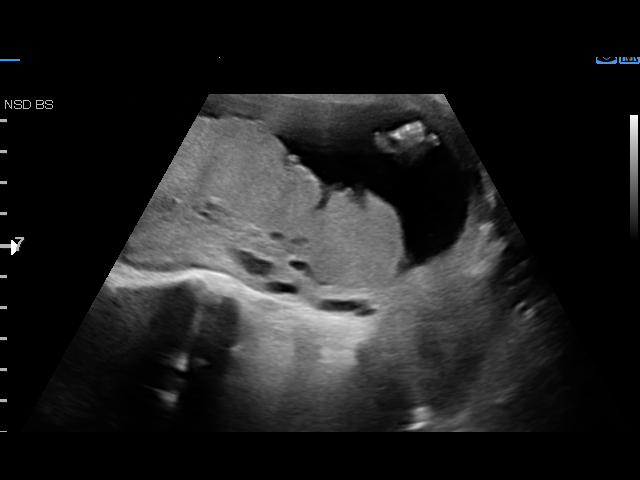
[im 36/46]
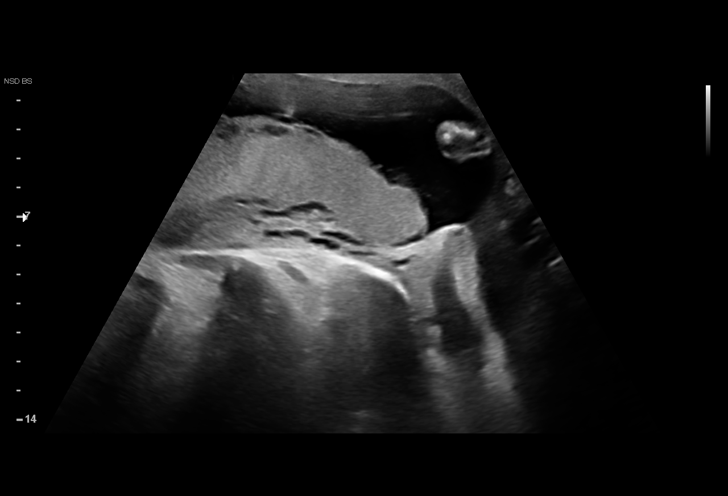
[im 39/46]
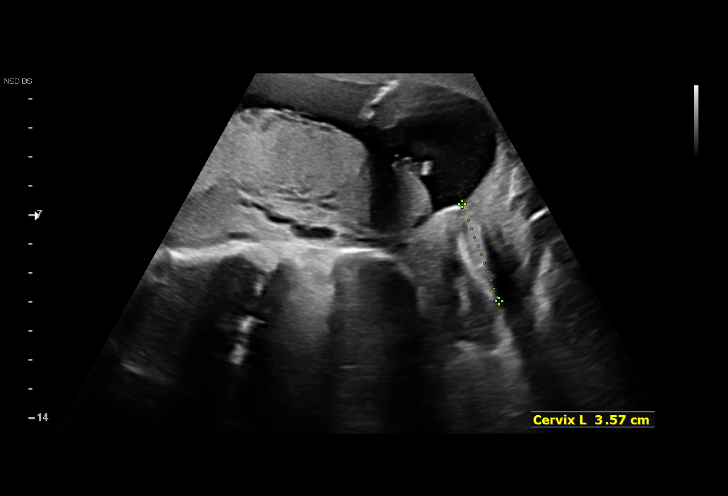
[im 42/46]
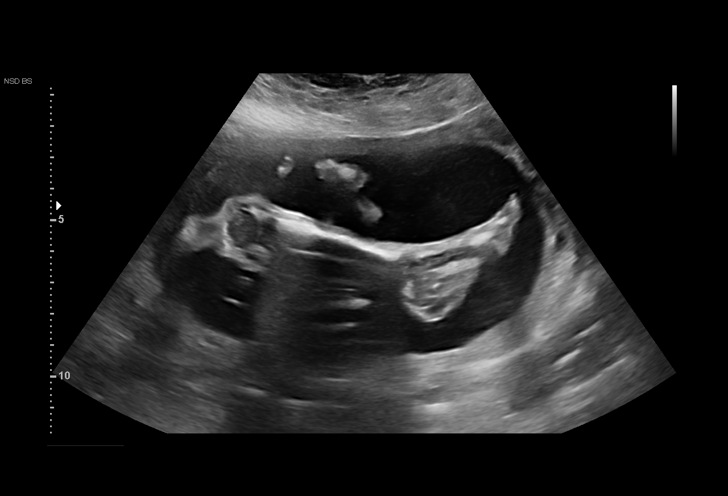
[im 46/46]
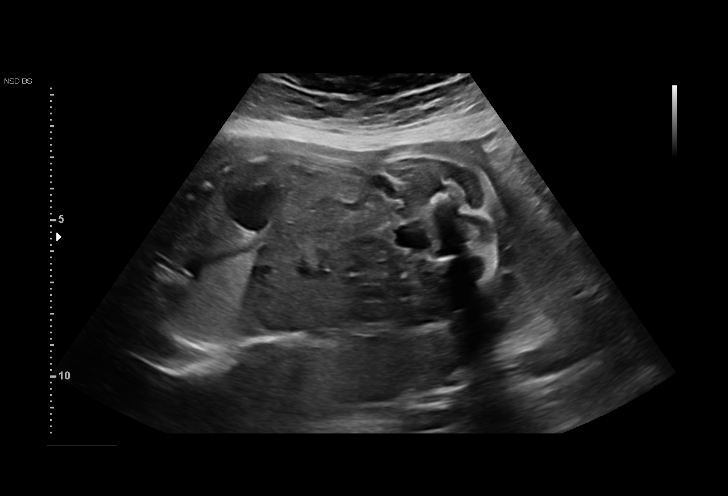

[14 of 28 positions shown; findings below may reference images not displayed]

[REDACTED]
                   [id number]

Indications

 Poor obstetric history: Previous fetal growth
 restriction (FGR)
 25 weeks gestation of pregnancy
 Low lying placenta, antepartum
 Encounter for antenatal screening for
 malformations (low risk NIPS)
Fetal Evaluation

 Num Of Fetuses:         1
 Fetal Heart Rate(bpm):  148
 Cardiac Activity:       Observed
 Presentation:           Transverse, head to maternal right
 Placenta:               Posterior, low-lying, 1.2cm from int os
 P. Cord Insertion:      Previously Visualized

 Amniotic Fluid
 AFI FV:      Within normal limits

                             Largest Pocket(cm)
                             5.
Biometry

 BPD:      61.4  mm     G. Age:  25w 0d         35  %    CI:        73.14   %    70 - 86
                                                         FL/HC:      19.9   %    18.7 -
 HC:      228.2  mm     G. Age:  24w 6d         19  %    HC/AC:      1.03        1.04 -
 AC:       222   mm     G. Age:  26w 4d         84  %    FL/BPD:     74.1   %    71 - 87
 FL:       45.5  mm     G. Age:  25w 0d         34  %    FL/AC:      20.5   %    20 - 24
 HUM:      40.7  mm     G. Age:  24w 5d         32  %

 LV:        4.9  mm

 Est. FW:     855  gm    1 lb 14 oz      70  %
OB History

 Gravidity:    3         Term:   2        Prem:   0        SAB:   0
 TOP:          0       Ectopic:  0        Living: 2
Gestational Age

 LMP:           25w 1d        Date:  08/17/19                 EDD:   05/23/20
 U/S Today:     25w 3d                                        EDD:   05/21/20
 Best:          25w 1d     Det. By:  LMP  (08/17/19)          EDD:   05/23/20
Anatomy

 Cranium:               Appears normal         Aortic Arch:            Previously seen
 Cavum:                 Previously seen        Ductal Arch:            Previously seen
 Ventricles:            Appears normal         Diaphragm:              Appears normal
 Choroid Plexus:        Previously seen        Stomach:                Appears normal, left
                                                                       sided
 Cerebellum:            Previously seen        Abdomen:                Appears normal
 Posterior Fossa:       Previously seen        Abdominal Wall:         Appears nml (cord
                                                                       insert, abd wall)
 Nuchal Fold:           Not applicable (>20    Cord Vessels:           Previously seen
                        wks GA)
 Face:                  Orbits previously      Kidneys:                Appear normal
                        seen
 Lips:                  Previously seen        Bladder:                Appears normal
 Thoracic:              Appears normal         Spine:                  Previously seen
 Heart:                 Appears normal         Upper Extremities:      Previously seen
                        (4CH, axis, and
                        situs)
 RVOT:                  Appears normal         Lower Extremities:      Previously seen
 LVOT:                  Appears normal

 Other:  Profile appears normal. 3VV and 3VTV previously visualized.
Cervix Uterus Adnexa

 Cervix
 Length:           3.57  cm.
 Normal appearance by transabdominal scan.
Impression

 Single intrauterine pregnancy here for follow up anatomy
 There is normal interval growth with good fetal movement and
 amniotic fluid
 Anatomy is cleared today, however, the placenta is still low
 lying.
Recommendations

 Follow up placental location scheduled between 30-32 weeks.

## 2021-06-21 ENCOUNTER — Ambulatory Visit: Payer: Medicaid Other | Admitting: Family

## 2021-06-26 ENCOUNTER — Ambulatory Visit (HOSPITAL_BASED_OUTPATIENT_CLINIC_OR_DEPARTMENT_OTHER): Payer: Medicaid Other | Admitting: Family Medicine

## 2021-06-26 ENCOUNTER — Encounter (HOSPITAL_BASED_OUTPATIENT_CLINIC_OR_DEPARTMENT_OTHER): Payer: Self-pay

## 2021-08-02 ENCOUNTER — Ambulatory Visit (HOSPITAL_BASED_OUTPATIENT_CLINIC_OR_DEPARTMENT_OTHER): Payer: Medicaid Other | Admitting: Family Medicine

## 2021-08-16 ENCOUNTER — Encounter (HOSPITAL_BASED_OUTPATIENT_CLINIC_OR_DEPARTMENT_OTHER): Payer: Self-pay | Admitting: Family Medicine

## 2021-08-16 ENCOUNTER — Other Ambulatory Visit: Payer: Self-pay

## 2021-08-16 ENCOUNTER — Ambulatory Visit (HOSPITAL_BASED_OUTPATIENT_CLINIC_OR_DEPARTMENT_OTHER): Payer: Medicaid Other | Admitting: Family Medicine

## 2021-08-16 DIAGNOSIS — Z9889 Other specified postprocedural states: Secondary | ICD-10-CM | POA: Insufficient documentation

## 2021-08-16 DIAGNOSIS — M545 Low back pain, unspecified: Secondary | ICD-10-CM | POA: Insufficient documentation

## 2021-08-16 NOTE — Assessment & Plan Note (Signed)
Suspect current symptoms are related to lumbar paraspinal muscle spasm/strain Recommend conservative treatment including OTC medications, topical therapies Recommend referral to physical therapy, home exercise program as per PT, will refer to [place] for follow-up in about 6 to 8 weeks to monitor Consider imaging if not improving as expected

## 2021-08-16 NOTE — Patient Instructions (Signed)
°  Medication Instructions:  °Your physician recommends that you continue on your current medications as directed. Please refer to the Current Medication list given to you today. °--If you need a refill on any your medications before your next appointment, please call your pharmacy first. If no refills are authorized on file call the office.-- ° °Referrals/Procedures/Imaging: °A referral has been placed for you to go to Outpatient Physical Therapy at [facility]. Someone from the scheduling department will be in contact with you in regards to coordinating your consultation. If you do not hear from any of the schedulers within 7-10 business days please give our office a call. ° °[facility] [address] °Located on the 1st Floor °Hours of Operation: °Monday - Friday 8:00 am - 5:00 pm °Closed on weekends and all major holidays °(P) [phone] ° °Follow-Up: °Your next appointment:   °Your physician recommends that you schedule a follow-up appointment in: 6-8 WEEKS with Dr. de Cuba ° °You will receive a text message or e-mail with a link to a survey about your care and experience with us today! We would greatly appreciate your feedback!  ° °Thanks for letting us be apart of your health journey!!  °Primary Care and Sports Medicine  ° °Dr. Raymond de Cuba  ° °We encourage you to activate your patient portal called "MyChart".  Sign up information is provided on this After Visit Summary.  MyChart is used to connect with patients for Virtual Visits (Telemedicine).  Patients are able to view lab/test results, encounter notes, upcoming appointments, etc.  Non-urgent messages can be sent to your provider as well. To learn more about what you can do with MyChart, please visit --  [email or web address]   ° °

## 2021-08-16 NOTE — Assessment & Plan Note (Signed)
Bilateral nipple piercings inspected today.  No obvious abnormality on exam, noted concerning discharge or surrounding erythema or redness Discussed findings with patient, discussed changes to be mindful of, discussed proper care

## 2021-08-16 NOTE — Progress Notes (Signed)
New Patient Office Visit  Subjective:  Patient ID: Veronica Shea, female    DOB: 10-Feb-1991  Age: 31 y.o. MRN: [id number]  CC:  Chief Complaint  Patient presents with   Establish Care    Prior Care has only been during pregnancy and [facility]   Back Pain    Patient presents today with concerns of lower back pain and pelvic pain intermittently for the last month. She states the pain episodes last for 2-3 hours and she has to not move and be completely still in order to get any type of relief from the pain. She states he back pain is primarily around the area of her most recent epidural. She also experiences some cramping with episodes. She states her menstrual cycle is regular and these episodes are not associated.     HPI Veronica Shea is a 31 year old female presenting to establish in clinic.  She has current concerns as outlined above.  She also has concerns regarding recent piercings.  Denies significant past medical history.  Back pain: Primarily located over lumbar spine bilaterally.  Over the past month she has had about 2 episodes where she has had increased pain in the last for couple hours.  She has tried Tylenol with some relief.  She has not had any associated numbness or tingling, no radiation of the pain.  Denies any prior issues related to back pain.  Piercings: Reports that about 2 months ago she had bilateral nipple piercings completed.  She is concerns today and is wanting to make sure that they "look healthy" and that there are not any signs of complication or infection.  She denies any issues with redness or pain related to the piercings.  She occasionally has some white crusting around the piercings but no discharge otherwise.  Patient is originally from [place], moved to the area in 2003.  She is not currently working.  Primarily spends her time with family.  Past Medical History:  Diagnosis Date   Genital herpes     Past  Surgical History:  Procedure Laterality Date   NO PAST SURGERIES     TUBAL LIGATION Bilateral 05/24/2020   Procedure: POST PARTUM TUBAL LIGATION;  Surgeon: Kathrynn Running, MD;  Location: [facility] LD ORS;  Service: Gynecology;  Laterality: Bilateral;    Family History  Problem Relation Age of Onset   Hypertension Father    Diabetes Father    Diabetes Sister     Social History   Socioeconomic History   Marital status: Single    Spouse name: Not on file   Number of children: Not on file   Years of education: Not on file   Highest education level: Not on file  Occupational History   Not on file  Tobacco Use   Smoking status: Never   Smokeless tobacco: Never  Vaping Use   Vaping Use: Never used  Substance and Sexual Activity   Alcohol use: Yes    Alcohol/week: 1.0 standard drink    Types: 1 Cans of beer per week    Comment: Occasionally   Drug use: No   Sexual activity: Yes    Birth control/protection: Surgical  Other Topics Concern   Not on file  Social History Narrative   Not on file   Social Determinants of Health   Financial Resource Strain: Not on file  Food Insecurity: Not on file  Transportation Needs: Not on file  Physical Activity: Not on file  Stress: Not on file  Social Connections: Not on file  Intimate Partner Violence: Not on file    Objective:   Today's Vitals: BP 102/70    Pulse 69    Ht 4\' 10"  (1.473 m)    Wt 133 lb (60.3 kg)    SpO2 98%    Breastfeeding Yes    BMI 27.80 kg/m   Physical Exam  31 year old female in no acute distress Cardiovascular exam with regular rate and rhythm, no murmur appreciated Lungs clear to auscultation bilaterally Inspection and assessment of bilateral nipple piercing completed in presence of chaperone: 32.  Area surrounding piercings is without abnormality, normal-appearing areola bilaterally.  No notable redness or erythema present.  No significant discharge around piercings.  Assessment & Plan:    Problem List Items Addressed This Visit       Other   Low back pain    Suspect current symptoms are related to lumbar paraspinal muscle spasm/strain Recommend conservative treatment including OTC medications, topical therapies Recommend referral to physical therapy, home exercise program as per PT, will refer to [facility] downstairs Plan for follow-up in about 6 to 8 weeks to monitor Consider imaging if not improving as expected      Relevant Orders   Ambulatory referral to Physical Therapy   History of body piercing    Bilateral nipple piercings inspected today.  No obvious abnormality on exam, noted concerning discharge or surrounding erythema or redness Discussed findings with patient, discussed changes to be mindful of, discussed proper care       Outpatient Encounter Medications as of 08/16/2021  Medication Sig   [DISCONTINUED] acetaminophen (TYLENOL) 325 MG tablet Take 2 tablets (650 mg total) by mouth every 4 (four) hours as needed (for pain scale < 4).   [DISCONTINUED] amLODipine (NORVASC) 5 MG tablet TAKE 1 TABLET (5 MG TOTAL) BY MOUTH DAILY. (Patient not taking: Reported on 06/28/2020)   [DISCONTINUED] Prenatal Vit-Fe Fumarate-FA (MULTIVITAMIN-PRENATAL) 27-0.8 MG TABS tablet Take 1 tablet by mouth daily at 12 noon.   No facility-administered encounter medications on file as of 08/16/2021.    Follow-up: Return in about 8 weeks (around 10/11/2021) for Follow Up.  Plan for follow-up of back pain at next visit.  Likely arrange for CPE following next visit.  Labs to be checked in the future to include CBC, CMP, lipid panel, TSH with reflex, A1c.  Marky Buresh J De 12/11/2021, MD

## 2021-08-29 IMAGING — US US MFM OB TRANSVAGINAL
1 series · 14 of 28 positions shown · non-contrast
Comparison: none

[Series 1: us mfm ob transvaginal · 50 acquisitions, 14 frames shown]
[im 2/50]
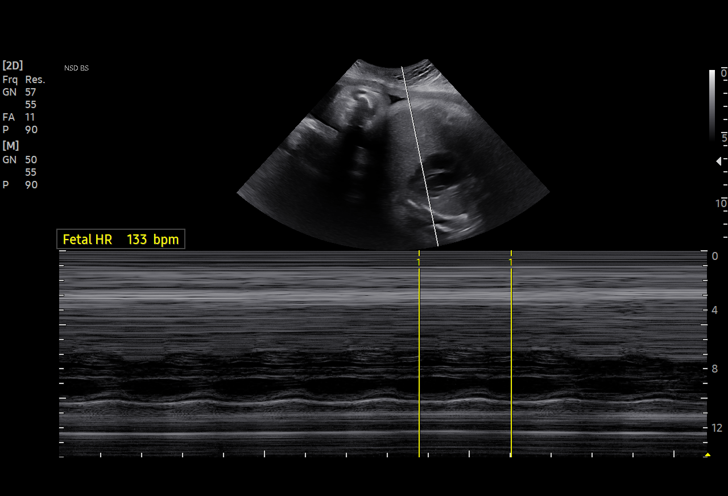
[im 6/50]
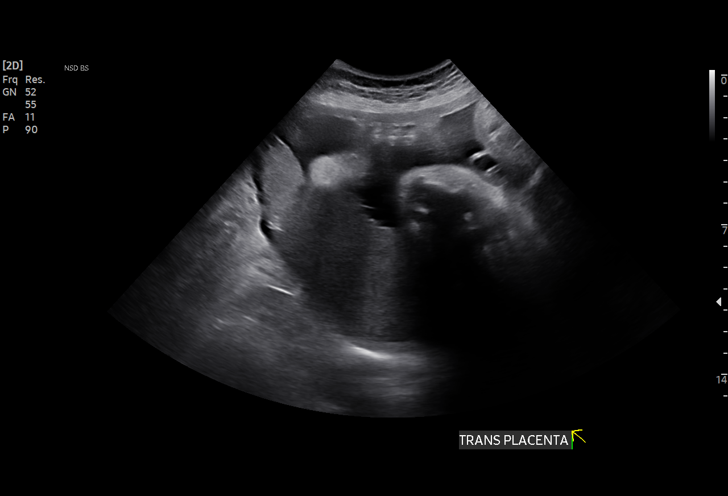
[im 10/50]
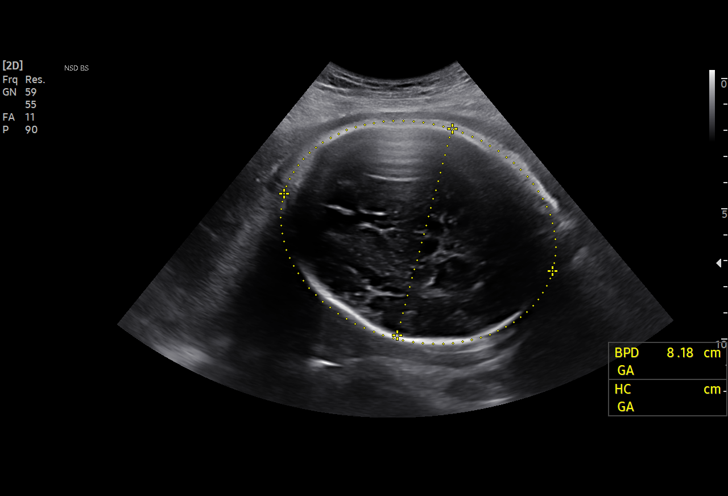
[im 13/50]
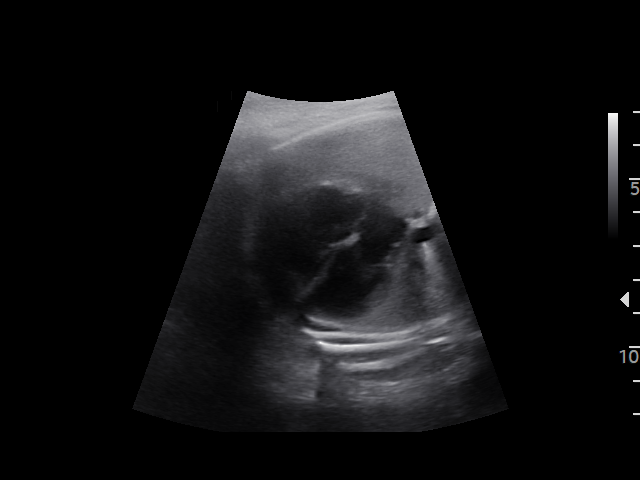
[im 17/50]
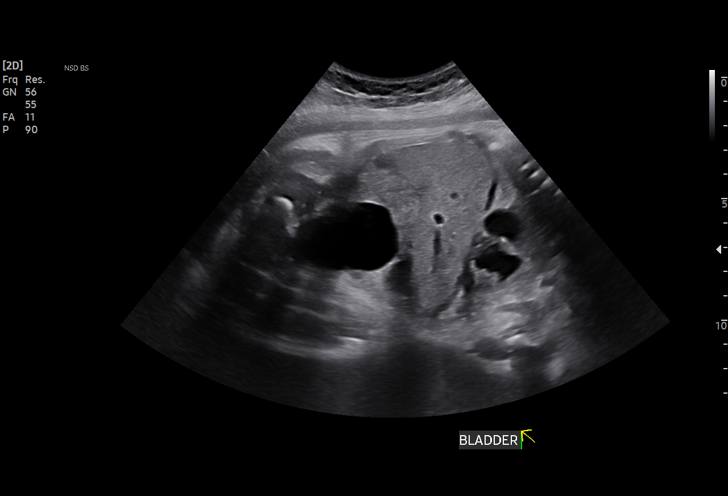
[im 20/50]
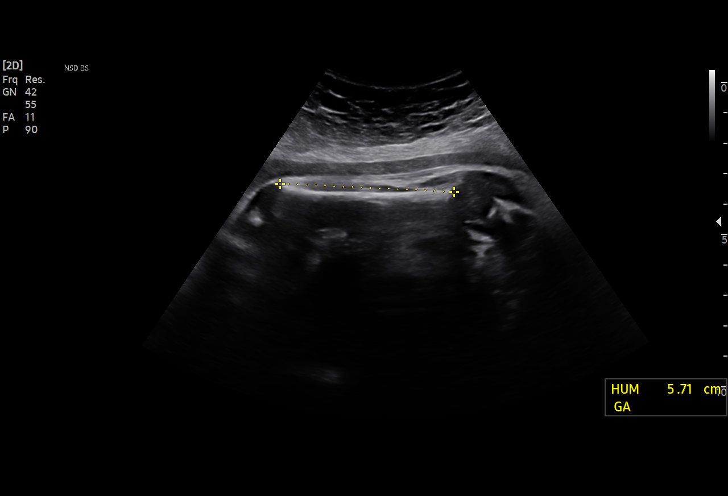
[im 24/50]
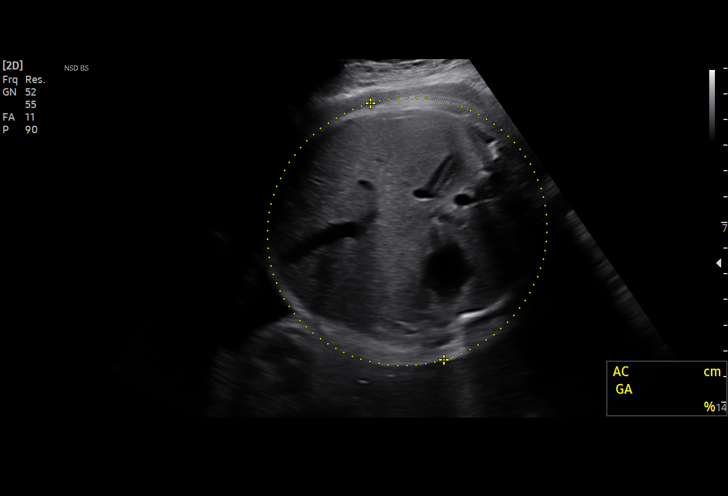
[im 28/50]
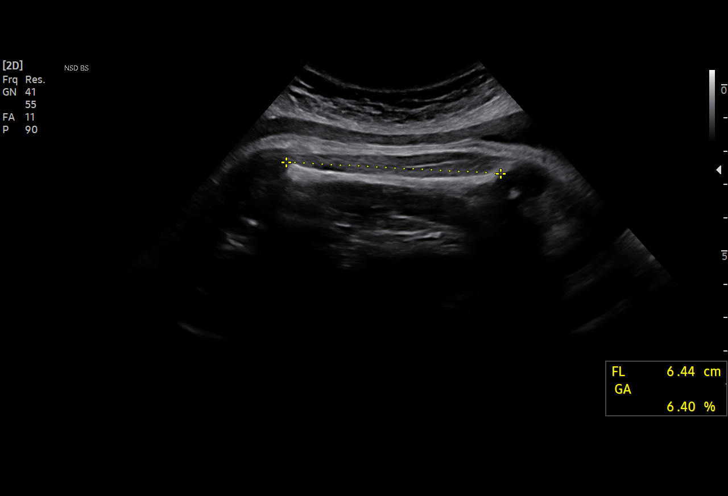
[im 31/50]
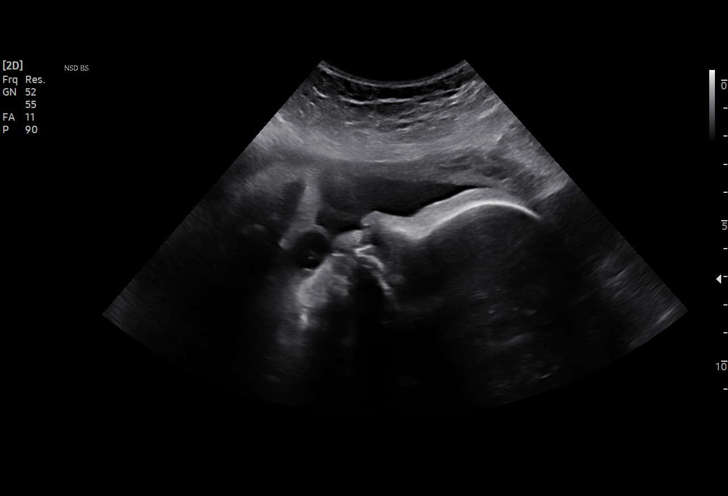
[im 35/50]
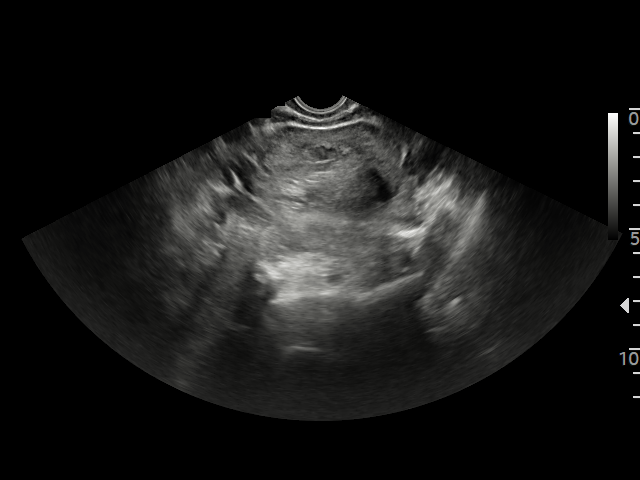
[im 39/50]
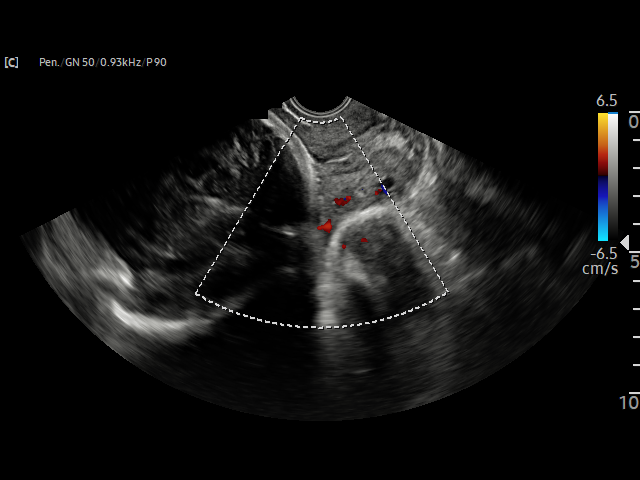
[im 42/50]
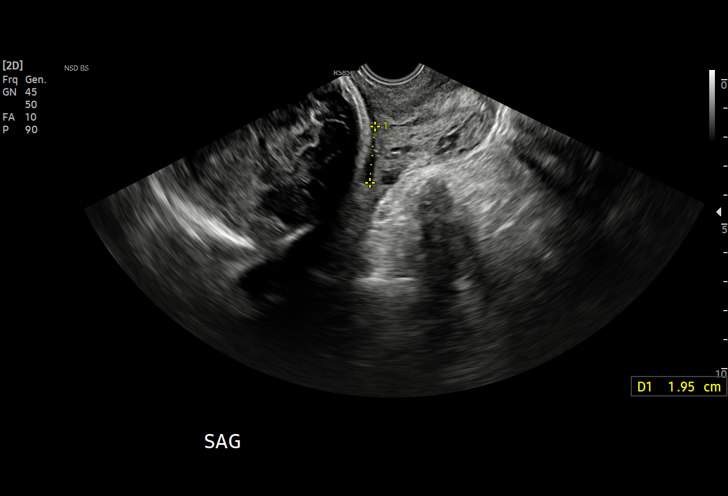
[im 46/50]
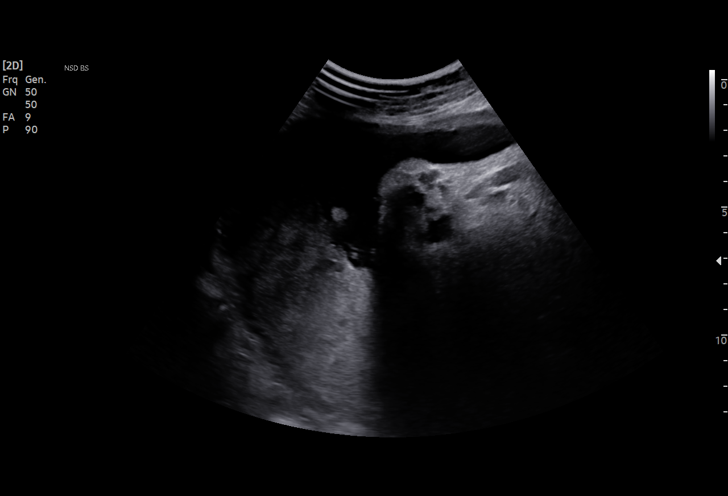
[im 50/50]
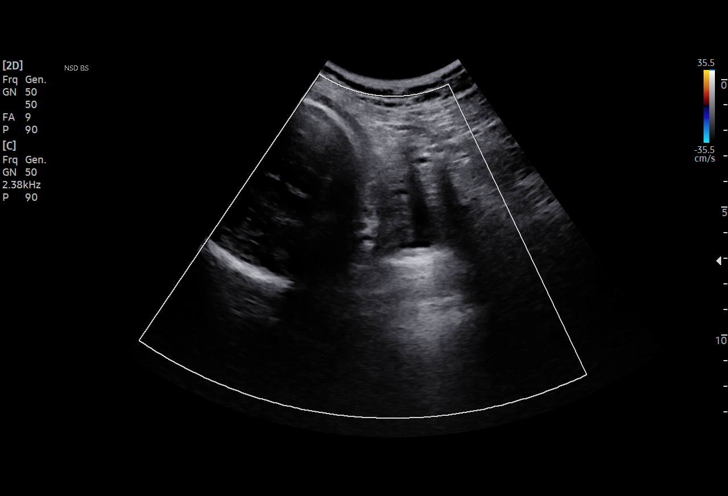

[14 of 28 positions shown; findings below may reference images not displayed]

[REDACTED]
                   [id number]

Indications

 Low lying placenta, antepartum
 Poor obstetric history: Previous fetal growth
 restriction (FGR)
 35 weeks gestation of pregnancy
 Low risk NIPS
 Encounter for other antenatal screening
 follow-up
Fetal Evaluation

 Num Of Fetuses:         1
 Fetal Heart Rate(bpm):  133
 Cardiac Activity:       Observed
 Presentation:           Cephalic
 Placenta:               Posterior
 P. Cord Insertion:      Previously Visualized

 Amniotic Fluid
 AFI FV:      Within normal limits

 AFI Sum(cm)     %Tile       Largest Pocket(cm)
 14.54           52
 RUQ(cm)       RLQ(cm)       LUQ(cm)        LLQ(cm)

Biometry

 BPD:      82.1  mm     G. Age:  33w 0d          6  %    CI:         75.6   %    70 - 86
                                                         FL/HC:      21.5   %    20.1 -
 HC:      299.4  mm     G. Age:  33w 1d        < 1  %    HC/AC:      0.90        0.93 -
 AC:      333.2  mm     G. Age:  37w 2d         96  %    FL/BPD:     78.6   %    71 - 87
 FL:       64.5  mm     G. Age:  33w 2d          7  %    FL/AC:      19.4   %    20 - 24
 HUM:      56.8  mm     G. Age:  33w 0d         23  %
 LV:        4.4  mm

 Est. FW:    9999  gm    5 lb 14 oz      53  %
OB History

 Gravidity:    3         Term:   2        Prem:   0        SAB:   0
 TOP:          0       Ectopic:  0        Living: 2
Gestational Age

 LMP:           35w 1d        Date:  08/17/19                 EDD:   05/23/20
 U/S Today:     34w 1d                                        EDD:   05/30/20
 Best:          35w 1d     Det. By:  LMP  (08/17/19)          EDD:   05/23/20
Anatomy

 Cranium:               Appears normal         Aortic Arch:            Previously seen
 Cavum:                 Previously seen        Ductal Arch:            Previously seen
 Ventricles:            Appears normal         Diaphragm:              Appears normal
 Choroid Plexus:        Previously seen        Stomach:                Appears normal, left
                                                                       sided
 Cerebellum:            Previously seen        Abdomen:                Previously seen
 Posterior Fossa:       Previously seen        Abdominal Wall:         Previously seen
 Nuchal Fold:           Not applicable (>20    Cord Vessels:           Previously seen
                        wks GA)
 Face:                  Orbits previously      Kidneys:                Appear normal
                        seen
 Lips:                  Previously seen        Bladder:                Appears normal
 Thoracic:              Appears normal         Spine:                  Previously seen
 Heart:                 Previously seen        Upper Extremities:      Previously seen
 RVOT:                  Previously seen        Lower Extremities:      Previously seen
 LVOT:                  Previously seen

 Other:  Female gender previously seen. 3VV and 3VTV previously visualized.
Cervix Uterus Adnexa

 Cervix
 Normal appearance by transvaginal scan
Comments

 This patient was seen for a follow up growth scan due to due
 to a low-lying placenta that was noted during her prior
 ultrasound exams.  She denies any problems since her last
 exam.
 She was informed that the fetal growth and amniotic fluid
 level appears appropriate for her gestational age.
 The previously noted low-lying placenta appears to have
 resolved on today's exam.  There were no signs of placenta
 previa noted on a transvaginal ultrasound performed today.
 As the placenta previa has resolved, the patient was advised
 that she may attempt a vaginal delivery should she desire.

## 2021-08-30 ENCOUNTER — Ambulatory Visit (HOSPITAL_BASED_OUTPATIENT_CLINIC_OR_DEPARTMENT_OTHER): Payer: Medicaid Other | Admitting: Family Medicine

## 2021-09-21 ENCOUNTER — Ambulatory Visit (HOSPITAL_BASED_OUTPATIENT_CLINIC_OR_DEPARTMENT_OTHER): Payer: Self-pay | Admitting: Physical Therapy

## 2021-10-04 ENCOUNTER — Other Ambulatory Visit: Payer: Self-pay

## 2021-10-04 ENCOUNTER — Encounter (HOSPITAL_BASED_OUTPATIENT_CLINIC_OR_DEPARTMENT_OTHER): Payer: Self-pay | Admitting: Physical Therapy

## 2021-10-04 ENCOUNTER — Ambulatory Visit (HOSPITAL_BASED_OUTPATIENT_CLINIC_OR_DEPARTMENT_OTHER): Payer: Medicaid Other | Attending: Family Medicine | Admitting: Physical Therapy

## 2021-10-04 DIAGNOSIS — M545 Low back pain, unspecified: Secondary | ICD-10-CM | POA: Diagnosis not present

## 2021-10-04 NOTE — Therapy (Signed)
OUTPATIENT PHYSICAL THERAPY THORACOLUMBAR EVALUATION   Patient Name: Graycen Degan MRN: [id number] DOB:09-30-1990, 31 y.o., female Today's Date: 10/04/2021   PT End of Session - 10/04/21 0906     Visit Number 1    Number of Visits 12    Date for PT Re-Evaluation 11/15/21    Authorization Type Managed MCD; healthy blue MCD    PT Start Time 0811    PT Stop Time 0852    PT Time Calculation (min) 41 min    Activity Tolerance Patient tolerated treatment well    Behavior During Therapy [facility] for tasks assessed/performed             Past Medical History:  Diagnosis Date   Genital herpes    Past Surgical History:  Procedure Laterality Date   NO PAST SURGERIES     TUBAL LIGATION Bilateral 05/24/2020   Procedure: POST PARTUM TUBAL LIGATION;  Surgeon: Kathrynn Running, MD;  Location: [facility] LD ORS;  Service: Gynecology;  Laterality: Bilateral;   Patient Active Problem List   Diagnosis Date Noted   Low back pain 08/16/2021   History of body piercing 08/16/2021   History of herpes genitalis 01/13/2014    PCP: de [place], Raymond J, MD  REFERRING PROVIDER: de [place], Raymond J, MD  REFERRING DIAG: M54.50 (ICD-10-CM) - Acute bilateral low back pain without sciatica   THERAPY DIAG:  Low back pain without sciatica, unspecified back pain laterality, unspecified chronicity  ONSET DATE: March 2022  SUBJECTIVE:                                                                                                                                                                                           SUBJECTIVE STATEMENT: Pt reports pain beginning around 6 months after delivering her baby.  Pt denies any specific injury.  Pt reports having progressively increased pain which taking care of her baby may have contributed.  Pt reports her pain worsened when the weather turned colder.  Pt saw MD on 08/16/2021 and MD note indicated suspected sx's are related to lumbar paraspinal muscle  spasm/strain.    Pt has increased pain with carrying objects over 10 lbs including carrying a case of water/soda.  She has pain with carrying her diaper bag and also her baby.  Pt states she feels fine ambulating though does have pain when sitting after walking a long distance.  She has pain with sitting.  Pt denies any radicular sx's into LE's though does report tightness and cramping in bilat LE's.   Pt has 3 children who are 23 years old, 73 years old, and 44 months old.  PERTINENT HISTORY:  none  PAIN:  Are you having pain? Yes NPRS scale: 5/10 current, 8/10 worst, 1/10 best Pain location: central lumbar and just lateral to midline on bilat sides PAIN TYPE: aching Aggravating factors: sitting, lifting Relieving factors: cold shower  PRECAUTIONS: None  WEIGHT BEARING RESTRICTIONS No  FALLS:  Has patient fallen in last 6 months? No   PLOF: Independent; Pt was able to perform all of her ADLs/IADLs without lumbar pain.    PATIENT GOALS reduce pain; to be able to carry objects without pain   OBJECTIVE:   DIAGNOSTIC FINDINGS:  None  PATIENT SURVEYS:  Modified Oswestry 16%   SCREENING FOR RED FLAGS: Bowel or bladder incontinence: No Spinal tumors: No Cauda equina syndrome: No Compression fracture: No Abdominal aneurysm: No  COGNITION:  Overall cognitive status: Within functional limits for tasks assessed     MUSCLE LENGTH: Hamstrings: tightness in bilat HS  POSTURE:  Level IC  PALPATION: Pt is tender to palpate in L2-4 SP's and R lumbar paraspinals   LUMBARAROM/PROM  A/PROM A/PROM  10/04/2021  Flexion WNL; pain returning to standing position  Extension WNL with pain  Right lateral flexion WFL with pain  Left lateral flexion WFL with pain  Right rotation [facility]   Left rotation WFL   (Blank rows = not tested)  LE AROM/PROM:  A/PROM Right 10/04/2021 Left 10/04/2021  Hip flexion    Hip extension WNL WNL  Hip abduction    Hip adduction    Hip internal  rotation    Hip external rotation [facility]  Knee flexion    Knee extension [facility]  Ankle dorsiflexion    Ankle plantarflexion    Ankle inversion    Ankle eversion     (Blank rows = not tested)  LE MMT:  MMT Right 10/04/2021 Left 10/04/2021  Hip flexion 5/5 5/5  Hip extension 5/5 5/5  Hip abduction    Hip adduction    Hip internal rotation    Hip external rotation 5/5 5/5  Knee flexion 5/5 5/5  Knee extension 5/5 5/5  Ankle dorsiflexion    Ankle plantarflexion    Ankle inversion    Ankle eversion     (Blank rows = not tested)  LUMBAR SPECIAL TESTS:  Supine SLR test:  negative bilat   GAIT: Assistive device utilized: None Level of assistance: Complete Independence Comments: Pt ambulates with a normalized heel to toe gait pattern without limping.  Pt has limited reciprocal arm swing.     TODAY'S TREATMENT  Pt performed supine TrA contraction with 5 sec hold and supine PPT.  Pt was educated in palpation and contraction of TrA.   Pt states exercises felt relaxing and had no pain.  Pt received a HEP.    PATIENT EDUCATION:  Education details: POC, rationale of exercises, objective findings.  Pt received a HEP handout and was educated in correct form and appropriate frequency.  Person educated: Patient Education method: Explanation, Demonstration, Tactile cues, Verbal cues, and Handouts Education comprehension: verbalized understanding, returned demonstration, verbal cues required, tactile cues required, and needs further education   HOME EXERCISE PROGRAM: Access Code: [email or web address] URL: [email or web address] Date: 10/04/2021 Prepared by: Aaron Edelman  Exercises Supine Transversus Abdominis Bracing - Hands on Stomach - 2-3 x daily - 7 x weekly - 1-2 sets - 10 reps - 5 seconds hold Supine Posterior Pelvic Tilt - 2 x daily - 7 x weekly - 2 sets - 10 reps   ASSESSMENT:  CLINICAL IMPRESSION: Patient is  a 31 y.o. female with a dx of   Pt has increased pain  with carrying objects including a case of water/soda and her diaper bag.  She also has pain with lifting and carrying her baby.  Pt states she feels fine ambulating though does have pain when sitting after walking a long distance.  She has increased pain with sitting.  Pt has good strength in bilat LE's though should benefit from a core stabilization program.  Pt has good AROM t/o lumbar spine though had pain with the majority of ROM testing.  Pt has tightness in bilat HS.  Pt should benefit from skilled PT services to address impairments and improve overall function.   OBJECTIVE IMPAIRMENTS decreased activity tolerance, decreased strength, impaired flexibility, and pain.   ACTIVITY LIMITATIONS  lifting and carrying objects .      REHAB POTENTIAL: Good  CLINICAL DECISION MAKING: Stable/uncomplicated  EVALUATION COMPLEXITY: Low   GOALS:   SHORT TERM GOALS:  Pt will be independent and compliant with HEP for improved pain, core strength, and function.   Baseline: Pt has no HEP. Target date: 10/25/2021 Goal status: INITIAL  2.  Pt will report at least a 25% improvement in pain and sx's overall.  Baseline: pain with lifting and sitting Target date: 10/25/2021 Goal status: INITIAL  3.  Pt's worst pain will be no > 6/10 for improved carrying objects. Baseline: 8/10 worst pain Target date: 10/25/2021 Goal status: INITIAL   LONG TERM GOALS:  Pt will be able to carry her diaper bag without increased pain.  Baseline: pain with carrying her diaper bag Target date: 11/15/2021 Goal status: INITIAL  2.  Pt will be able to perform her normal functional lifting and carrying including her baby and groceries without significant pain or difficulty.  Baseline: Pt has pain with carrying her baby and objects Target date: 11/15/2021 Goal status: INITIAL  3.  Pt will demo improved core strength by progressing with core exercises without adverse effects for improved tolerance with and performance  of functional lifting and carrying.  Baseline: Pt is not performing core exercises and does not have a core stabilization program.  Target date: 11/15/2021 Goal status: INITIAL    PLAN: PT FREQUENCY: 2x/week  PT DURATION: 6 weeks  PLANNED INTERVENTIONS: Therapeutic exercises, Therapeutic activity, Neuromuscular re-education, Gait training, Patient/Family education, Joint mobilization, Aquatic Therapy, Dry Needling, Electrical stimulation, Spinal manipulation, Spinal mobilization, Cryotherapy, Moist heat, Taping, and Manual therapy  PLAN FOR NEXT SESSION: review and perform HEP.  Progress core stab's and HEP.  HS stretching.    Check all possible CPT codes: 16384- Therapeutic Exercise, [phone]- Neuro Re-education, [phone] - Gait Training, [phone] - Manual Therapy, 97530 - Therapeutic Activities, [phone] - Self Care, [phone] - Electrical stimulation (unattended), [id number] - Ultrasound, [id number] - Physical performance training, and [id number] - Aquatic therapy     If treatment provided at initial evaluation, no treatment charged due to lack of authorization.       Audie Clear III PT, DPT 10/04/21 11:17 PM

## 2021-10-11 ENCOUNTER — Ambulatory Visit (HOSPITAL_BASED_OUTPATIENT_CLINIC_OR_DEPARTMENT_OTHER): Payer: Medicaid Other | Admitting: Family Medicine

## 2021-10-11 ENCOUNTER — Other Ambulatory Visit: Payer: Self-pay

## 2021-10-11 ENCOUNTER — Encounter (HOSPITAL_BASED_OUTPATIENT_CLINIC_OR_DEPARTMENT_OTHER): Payer: Self-pay | Admitting: Family Medicine

## 2021-10-11 VITALS — BP 117/72 | HR 74 | Ht <= 58 in | Wt 130.6 lb

## 2021-10-11 DIAGNOSIS — Z0000 Encounter for general adult medical examination without abnormal findings: Secondary | ICD-10-CM | POA: Diagnosis not present

## 2021-10-11 DIAGNOSIS — Z7689 Persons encountering health services in other specified circumstances: Secondary | ICD-10-CM

## 2021-10-11 DIAGNOSIS — M545 Low back pain, unspecified: Secondary | ICD-10-CM | POA: Diagnosis not present

## 2021-10-11 NOTE — Assessment & Plan Note (Signed)
Patient reports that she has been doing well and has noted significant improvement in her low back pain.  She has started working with physical therapy, planning for about 1 session per week.  They have also provided home exercise program for patient which she has been following ?Can continue with conservative measures at home, home exercise program as per PT ?Plan for follow-up as needed regarding this, particularly if she notes any worsening or new symptoms ?

## 2021-10-11 NOTE — Patient Instructions (Signed)
?  Medication Instructions:  ?Your physician recommends that you continue on your current medications as directed. Please refer to the Current Medication list given to you today. ?--If you need a refill on any your medications before your next appointment, please call your pharmacy first. If no refills are authorized on file call the office.-- ?Lab Work: ?Your physician has recommended that you have lab work today: complete lab panel ?If you have labs (blood work) drawn today and your tests are completely normal, you will receive your results via MyChart message OR a phone call from our staff.  ?Please ensure you check your voicemail in the event that you authorized detailed messages to be left on a delegated number. If you have any lab test that is abnormal or we need to change your treatment, we will call you to review the results. ? ? ? ?Follow-Up: ?Your next appointment:   ?Your physician recommends that you schedule a follow-up appointment in: 4-6 weeks with Dr. de Peru ? ?You will receive a text message or e-mail with a link to a survey about your care and experience with [place] today! We would greatly appreciate your feedback!  ? ?Thanks for letting us be apart of your health journey!!  ?Primary Care and Sports Medicine  ? ?Dr. Marcy Salvo de [place]  ? ?We encourage you to activate your patient portal called "MyChart".  Sign up information is provided on this After Visit Summary.  MyChart is used to connect with patients for Virtual Visits (Telemedicine).  Patients are able to view lab/test results, encounter notes, upcoming appointments, etc.  Non-urgent messages can be sent to your provider as well. To learn more about what you can do with MyChart, please visit --  [email or web address].    ?

## 2021-10-11 NOTE — Progress Notes (Signed)
? ? ?  Procedures performed today:   ? ?None. ? ?Independent interpretation of notes and tests performed by another provider:  ? ?None. ? ?Brief History, Exam, Impression, and Recommendations:   ? ?BP 117/72   Pulse 74   Ht 4\' 10"  (1.473 m)   Wt 130 lb 9.6 oz (59.2 kg)   SpO2 96%   BMI 27.30 kg/m?  ? ?Low back pain ?Patient reports that she has been doing well and has noted significant improvement in her low back pain.  She has started working with physical therapy, planning for about 1 session per week.  They have also provided home exercise program for patient which she has been following ?Can continue with conservative measures at home, home exercise program as per PT ?Plan for follow-up as needed regarding this, particularly if she notes any worsening or new symptoms ? ?Plan follow-up in about 4 to 6 weeks for CPE, will complete labs today in anticipation of wellness visit ? ? ?___________________________________________ ?Kaegan Hettich de , MD, ABFM, CAQSM ?Primary Care and Sports Medicine ?[facility] ?

## 2021-10-12 LAB — CBC WITH DIFFERENTIAL/PLATELET
Basophils Absolute: 0 10*3/uL (ref 0.0–0.2)
Basos: 0 %
EOS (ABSOLUTE): 0.2 10*3/uL (ref 0.0–0.4)
Eos: 2 %
Hematocrit: 40.7 % (ref 34.0–46.6)
Hemoglobin: 14.1 g/dL (ref 11.1–15.9)
Immature Grans (Abs): 0 10*3/uL (ref 0.0–0.1)
Immature Granulocytes: 0 %
Lymphocytes Absolute: 2.6 10*3/uL (ref 0.7–3.1)
Lymphs: 25 %
MCH: 29.3 pg (ref 26.6–33.0)
MCHC: 34.6 g/dL (ref 31.5–35.7)
MCV: 85 fL (ref 79–97)
Monocytes Absolute: 0.4 10*3/uL (ref 0.1–0.9)
Monocytes: 4 %
Neutrophils Absolute: 7.1 10*3/uL — ABNORMAL HIGH (ref 1.4–7.0)
Neutrophils: 69 %
Platelets: 325 10*3/uL (ref 150–450)
RBC: 4.81 x10E6/uL (ref 3.77–5.28)
RDW: 13.2 % (ref 11.7–15.4)
WBC: 10.3 10*3/uL (ref 3.4–10.8)

## 2021-10-12 LAB — COMPREHENSIVE METABOLIC PANEL
ALT: 8 IU/L (ref 0–32)
AST: 11 IU/L (ref 0–40)
Albumin/Globulin Ratio: 1.7 (ref 1.2–2.2)
Albumin: 4.5 g/dL (ref 3.8–4.8)
Alkaline Phosphatase: 137 IU/L — ABNORMAL HIGH (ref 44–121)
BUN/Creatinine Ratio: 22 (ref 9–23)
BUN: 13 mg/dL (ref 6–20)
Bilirubin Total: 0.8 mg/dL (ref 0.0–1.2)
CO2: 18 mmol/L — ABNORMAL LOW (ref 20–29)
Calcium: 8.8 mg/dL (ref 8.7–10.2)
Chloride: 105 mmol/L (ref 96–106)
Creatinine, Ser: 0.6 mg/dL (ref 0.57–1.00)
Globulin, Total: 2.6 g/dL (ref 1.5–4.5)
Glucose: 82 mg/dL (ref 70–99)
Potassium: 4 mmol/L (ref 3.5–5.2)
Sodium: 139 mmol/L (ref 134–144)
Total Protein: 7.1 g/dL (ref 6.0–8.5)
eGFR: 122 mL/min/{1.73_m2} (ref >59)

## 2021-10-12 LAB — LIPID PANEL
Chol/HDL Ratio: 6 ratio — ABNORMAL HIGH (ref 0.0–4.4)
Cholesterol, Total: 163 mg/dL (ref 100–199)
HDL: 27 mg/dL — ABNORMAL LOW (ref >39)
LDL Chol Calc (NIH): 75 mg/dL (ref 0–99)
Triglycerides: 384 mg/dL — ABNORMAL HIGH (ref 0–149)
VLDL Cholesterol Cal: 61 mg/dL — ABNORMAL HIGH (ref 5–40)

## 2021-10-12 LAB — TSH RFX ON ABNORMAL TO FREE T4: TSH: 0.51 u[IU]/mL (ref 0.450–4.500)

## 2021-10-12 LAB — HEMOGLOBIN A1C
Est. average glucose Bld gHb Est-mCnc: 100 mg/dL
Hgb A1c MFr Bld: 5.1 % (ref 4.8–5.6)

## 2021-10-18 ENCOUNTER — Encounter (HOSPITAL_BASED_OUTPATIENT_CLINIC_OR_DEPARTMENT_OTHER): Payer: Self-pay

## 2021-10-18 ENCOUNTER — Ambulatory Visit (HOSPITAL_BASED_OUTPATIENT_CLINIC_OR_DEPARTMENT_OTHER): Payer: Medicaid Other | Admitting: Physical Therapy

## 2021-10-24 ENCOUNTER — Ambulatory Visit (HOSPITAL_BASED_OUTPATIENT_CLINIC_OR_DEPARTMENT_OTHER): Payer: Medicaid Other | Admitting: Physical Therapy

## 2021-10-24 ENCOUNTER — Other Ambulatory Visit: Payer: Self-pay

## 2021-10-24 DIAGNOSIS — M545 Low back pain, unspecified: Secondary | ICD-10-CM | POA: Diagnosis not present

## 2021-10-24 NOTE — Therapy (Signed)
?OUTPATIENT PHYSICAL THERAPY TREATMENT NOTE ? ? ?Patient Name: Veronica Shea ?MRN: [id number] ?DOB:09/13/1990, 31 y.o., female ?Today's Date: 10/25/2021 ? ?PCP: de [place]uba, Raymond J, MD ?REFERRING PROVIDER: de [place]uba, Raymond J, MD ? ? PT End of Session - 10/24/21 1028   ? ? Visit Number 2   ? Number of Visits 12   ? Date for PT Re-Evaluation 11/15/21   ? Authorization Type Managed MCD; healthy blue MCD   ? PT Start Time 1025   ? PT Stop Time 1104   ? PT Time Calculation (min) 39 min   ? Activity Tolerance Patient tolerated treatment well   ? [facility] for tasks assessed/performed   ? ?  ?  ? ?  ? ? ?Past Medical History:  ?Diagnosis Date  ? Genital herpes   ? ?Past Surgical History:  ?Procedure Laterality Date  ? NO PAST SURGERIES    ? TUBAL LIGATION Bilateral 05/24/2020  ? Procedure: POST PARTUM TUBAL LIGATION;  Surgeon: Kathrynn RunningWouk, Noah Bedford, MD;  Location: [facility] LD ORS;  Service: Gynecology;  Laterality: Bilateral;  ? ?Patient Active Problem List  ? Diagnosis Date Noted  ? Low back pain 08/16/2021  ? History of body piercing 08/16/2021  ? History of herpes genitalis 01/13/2014  ? ? ?REFERRING PROVIDER: de [place]uba, Buren Kosaymond J, MD ?  ?REFERRING DIAG: M54.50 (ICD-10-CM) - Acute bilateral low back pain without sciatica  ?  ?THERAPY DIAG:  ?Low back pain without sciatica, unspecified back pain laterality, unspecified chronicity ?  ?ONSET DATE: March 2022 ?  ?SUBJECTIVE:                                                                                                                                                                                          ?  ?SUBJECTIVE STATEMENT: ?Pt has increased pain with carrying objects over 10 lbs including carrying a case of water/soda.  She has pain with carrying her diaper bag and also her baby.  Pt states she feels fine ambulating though does have pain when sitting after walking a long distance.  She has pain with sitting.  ?Pt has 3 children who are 39153 years old,  33 years old, and 2315 months old. ? ?Pt had to cancel her prior appt due to taking her baby to the ER.  Pt states she is feeling better.  "It is helping".  Pt reports compliance with HEP.  Pt states her exercises felt good and relaxing.  She denies pain currently.  Pt reports improved pain with carrying diaper bag. ?  ?  ?PERTINENT HISTORY:  ?none ?  ?PAIN:  ?Are you having pain?  Not currently ?NPRS scale: 0/10 current, 8/10 worst, 1/10 best ?Pain location: central lumbar and just lateral to midline on bilat sides ?PAIN TYPE: aching ?Aggravating factors: sitting, lifting ?Relieving factors: cold shower ?  ?PRECAUTIONS: None ?  ?WEIGHT BEARING RESTRICTIONS No ?  ?PLOF: Independent; Pt was able to perform all of her ADLs/IADLs without lumbar pain.   ?  ?PATIENT GOALS reduce pain; to be able to carry objects without pain ?  ?  ?OBJECTIVE:  ?  ?DIAGNOSTIC FINDINGS:  ?None   ?  ?  ?TODAY'S TREATMENT  ?Therapeutic Exercise: ?Pt performed:  ?supine TrA contraction with 5 sec hold x 10 reps.  Pt was educated in palpation and contraction of TrA. ?supine marching with TrA 2x10 reps ?Supine alt LE ext with TrA 2x10 reps ?Supine PPT x 10 reps ?Supine clams with GTB with PPT 2x10 reps ?Supine manual HS stretch x30 sec bilat ?Supine HS stretch with strap 2x20-30 seconds ?Standing rows with TrA with retraction with GTB 2x10 reps ?Standing shoulder extension with TrA with retraction with RTB 2x10 reps ? Pt received a HEP and was educated in correct form and appropriate frequency.    ?  ?  ?PATIENT EDUCATION:  ?Education details:  POC and rationale of exercises.  Reviewed and updated HEP.  Pt received a HEP handout and was educated in correct form and appropriate frequency.  Pt instructed she should not have pain with exercises.   ?Person educated: Patient ?Education method: Explanation, Demonstration, Tactile cues, Verbal cues, and Handouts ?Education comprehension: verbalized understanding, returned demonstration, verbal cues  required, tactile cues required, and needs further education ?  ?  ?HOME EXERCISE PROGRAM: ?Access Code: [email or web address] ?URL: [email or web address] ?Date: 10/24/2021 ?Prepared by: Aaron Edelman ? ?Exercises ?Supine Transversus Abdominis Bracing - Hands on Stomach - 2-3 x daily - 7 x weekly - 1-2 sets - 10 reps - 5 seconds hold ?Supine Posterior Pelvic Tilt - 2 x daily - 7 x weekly - 2 sets - 10 reps ?Supine March - 2 x daily - 7 x weekly - 2 sets - 10 reps ?Supine Transversus Abdominis Bracing with Leg Extension - 1 x daily - 7 x weekly - 2 sets - 10 reps ?Hooklying Clamshell with Resistance - 1 x daily - 5 x weekly - 2 sets - 10 reps ?Supine Hamstring Stretch with Strap - 2 x daily - 7 x weekly - 2-3 reps - 20-30 seconds hold ? ?  ?  ?ASSESSMENT: ?  ?CLINICAL IMPRESSION: ?Patient presents to Rx stating she is feeling better.  Pt is able to carry diaper bag with less pain.  Pt reports compliance with HEP.  PT progressed core exercises today and updated HEP.  Pt performed exercises well with cuing and instruction for correct form.  Pt responded well to Rx having no c/o's and no pain after Rx.  Pt should benefit from skilled PT services to address impairments and goals and improve overall function.  ?  ?OBJECTIVE IMPAIRMENTS decreased activity tolerance, decreased strength, impaired flexibility, and pain.  ?  ?ACTIVITY LIMITATIONS  lifting and carrying objects .  ?  ?  ?  ?  ?REHAB POTENTIAL: Good ?  ?CLINICAL DECISION MAKING: Stable/uncomplicated ?  ?EVALUATION COMPLEXITY: Low ?  ?  ?GOALS: ?  ?  ?SHORT TERM GOALS: ?  ?Pt will be independent and compliant with HEP for improved pain, core strength, and function.   ?Baseline: Pt has no HEP. ?Target date: 10/25/2021 ?Goal status: INITIAL ?  ?2.  Pt will  report at least a 25% improvement in pain and sx's overall.  ?Baseline: pain with lifting and sitting ?Target date: 10/25/2021 ?Goal status: INITIAL ?  ?3.  Pt's worst pain will be no > 6/10 for improved carrying  objects. ?Baseline: 8/10 worst pain ?Target date: 10/25/2021 ?Goal status: INITIAL ?  ?  ?LONG TERM GOALS: ?  ?Pt will be able to carry her diaper bag without increased pain.  ?Baseline: pain with carrying her diaper bag ?Target date: 11/15/2021 ?Goal status: INITIAL ?  ?2.  Pt will be able to perform her normal functional lifting and carrying including her baby and groceries without significant pain or difficulty.  ?Baseline: Pt has pain with carrying her baby and objects ?Target date: 11/15/2021 ?Goal status: INITIAL ?  ?3.  Pt will demo improved core strength by progressing with core exercises without adverse effects for improved tolerance with and performance of functional lifting and carrying.  ?Baseline: Pt is not performing core exercises and does not have a core stabilization program.  ?Target date: 11/15/2021 ?Goal status: INITIAL ?  ?  ?  ?PLAN: ?PT FREQUENCY: 2x/week ?  ?PT DURATION: 6 weeks ?  ?PLANNED INTERVENTIONS: Therapeutic exercises, Therapeutic activity, Neuromuscular re-education, Gait training, Patient/Family education, Joint mobilization, Aquatic Therapy, Dry Needling, Electrical stimulation, Spinal manipulation, Spinal mobilization, Cryotherapy, Moist heat, Taping, and Manual therapy ?  ?PLAN FOR NEXT SESSION: review and perform HEP.  Progress core stab's and HEP.  HS stretching.  Possible STM if tightness in lumbar. ?  ?                    ?Audie Clear III PT, DPT ?10/25/21 7:28 AM ? ? ?  ? ?

## 2021-10-25 ENCOUNTER — Encounter (HOSPITAL_BASED_OUTPATIENT_CLINIC_OR_DEPARTMENT_OTHER): Payer: Self-pay | Admitting: Physical Therapy

## 2021-10-31 ENCOUNTER — Ambulatory Visit (HOSPITAL_BASED_OUTPATIENT_CLINIC_OR_DEPARTMENT_OTHER): Payer: Medicaid Other | Admitting: Physical Therapy

## 2021-11-07 ENCOUNTER — Ambulatory Visit (HOSPITAL_BASED_OUTPATIENT_CLINIC_OR_DEPARTMENT_OTHER): Payer: Medicaid Other | Attending: Family Medicine | Admitting: Physical Therapy

## 2021-11-07 DIAGNOSIS — M545 Low back pain, unspecified: Secondary | ICD-10-CM | POA: Diagnosis not present

## 2021-11-07 NOTE — Therapy (Addendum)
OUTPATIENT PHYSICAL THERAPY TREATMENT NOTE   Patient Name: Veronica Shea MRN: [id number] DOB:11-01-1990, 31 y.o., female Today's Date: 11/08/2021  PCP: Tommi Rumps [place], Buren Kos, MD REFERRING PROVIDER: de [place], Raymond J, MD   PT End of Session - 11/07/21 1106     Visit Number 3    Number of Visits 12    Date for PT Re-Evaluation 11/15/21    Authorization Type Managed MCD; healthy blue MCD    PT Start Time 1028    PT Stop Time 1106    PT Time Calculation (min) 38 min    Activity Tolerance Patient tolerated treatment well    Behavior During Therapy WFL for tasks assessed/performed              Past Medical History:  Diagnosis Date   Genital herpes    Past Surgical History:  Procedure Laterality Date   NO PAST SURGERIES     TUBAL LIGATION Bilateral 05/24/2020   Procedure: POST PARTUM TUBAL LIGATION;  Surgeon: Kathrynn Running, MD;  Location: [facility];  Service: Gynecology;  Laterality: Bilateral;   Patient Active Problem List   Diagnosis Date Noted   Low back pain 08/16/2021   History of body piercing 08/16/2021   History of herpes genitalis 01/13/2014    REFERRING PROVIDER: de [place], Raymond J, MD   REFERRING DIAG: M54.50 (ICD-10-CM) - Acute bilateral low back pain without sciatica    THERAPY DIAG:  Low back pain without sciatica, unspecified back pain laterality, unspecified chronicity   ONSET DATE: March 2022   SUBJECTIVE:                                                                                                                                                                                            SUBJECTIVE STATEMENT: Pt states she is feeling better and reports her back is improving.  Pt reports reduced pain with carrying objects.  "Pt states, it doesn't hurt as much".  Pt reports compliance with HEP.  She denies pain currently.  Pt reports improved pain with carrying diaper bag.     PERTINENT HISTORY:  none   PAIN:  Are you having  pain? Not currently NPRS scale: 1/10 current, 5/10 worst, 0/10 best Pain location: central lumbar and just lateral to midline on bilat sides PAIN TYPE: aching Aggravating factors: sitting, lifting Relieving factors: cold shower   PRECAUTIONS: None   WEIGHT BEARING RESTRICTIONS No   PLOF: Independent; Pt was able to perform all of her ADLs/IADLs without lumbar pain.     PATIENT GOALS reduce pain; to be able to carry objects without pain  OBJECTIVE:    DIAGNOSTIC FINDINGS:  None       TODAY'S TREATMENT  Therapeutic Exercise: Pt performed:  supine alt UE/LE with TrA 2x10 reps Supine alt LE ext with TrA 2x10 reps while holding 2# Supine PPT x 12 reps Supine clams with GTB with PPT 2x10 reps Supine SLR with PPT x 10 reps bilat Supine shoulder flex/ext with ball with PPT  Supine HS stretch with strap 2x20-30 seconds Seated HS stretch 2x20-30 sec bilat Standing rows with TrA with retraction with GTB 2x10 reps Standing shoulder extension with TrA with retraction with RTB 2x10 reps  Pt received a HEP and was educated in correct form and appropriate frequency.     Manual Therapy: Pt received STM to R > L sided paraspinals in prone to improve pain, tightness, and mobility.      PATIENT EDUCATION:  Education details:  POC and rationale of exercises. HEP.   Person educated: Patient Education method: Explanation, Demonstration, Tactile cues, Verbal cues, and Handouts Education comprehension: verbalized understanding, returned demonstration, verbal cues required, tactile cues required, and needs further education     HOME EXERCISE PROGRAM: Access Code: [email or web address] URL: [email or web address] Date: 10/24/2021 Prepared by: Aaron Edelmanrey Jedd Schulenburg  Exercises Supine Transversus Abdominis Bracing - Hands on Stomach - 2-3 x daily - 7 x weekly - 1-2 sets - 10 reps - 5 seconds hold Supine Posterior Pelvic Tilt - 2 x daily - 7 x weekly - 2 sets - 10 reps Supine March - 2 x daily -  7 x weekly - 2 sets - 10 reps Supine Transversus Abdominis Bracing with Leg Extension - 1 x daily - 7 x weekly - 2 sets - 10 reps Hooklying Clamshell with Resistance - 1 x daily - 5 x weekly - 2 sets - 10 reps Supine Hamstring Stretch with Strap - 2 x daily - 7 x weekly - 2-3 reps - 20-30 seconds hold      ASSESSMENT:   CLINICAL IMPRESSION: Pt responded well to Rx having no c/o's and no pain after Rx.  Pt should benefit from skilled PT services to address impairments and goals and improve overall function.  Pt is progressing well with pain, sx's, and function.  Her worst pain has improved from 8/10 initially to 5/10 currently.  She is able to carry objects with much less pain including her diaper bag.  Pt reports compliance with HEP.  PT progressed core exercises today and pt did well. She demonstrates improved core strength as evidenced by progression and performance of exercises.  Pt had some tightness and tenderness in R sided lumbar paraspinals though much less tightness and no tenderness on L side.  Pt states she felt better after STM.  Pt responded well to Rx having no increased pain after Rx.   OBJECTIVE IMPAIRMENTS decreased activity tolerance, decreased strength, impaired flexibility, and pain.    ACTIVITY LIMITATIONS  lifting and carrying objects .          REHAB POTENTIAL: Good   CLINICAL DECISION MAKING: Stable/uncomplicated   EVALUATION COMPLEXITY: Low     GOALS:     SHORT TERM GOALS:   Pt will be independent and compliant with HEP for improved pain, core strength, and function.   Baseline: Pt has no HEP. Target date: 10/25/2021 Goal status: INITIAL   2.  Pt will report at least a 25% improvement in pain and sx's overall.  Baseline: pain with lifting and sitting Target date: 10/25/2021 Goal status: INITIAL   3.  Pt's  worst pain will be no > 6/10 for improved carrying objects. Baseline: 8/10 worst pain Target date: 10/25/2021 Goal status: INITIAL     LONG  TERM GOALS:   Pt will be able to carry her diaper bag without increased pain.  Baseline: pain with carrying her diaper bag Target date: 11/15/2021 Goal status: INITIAL   2.  Pt will be able to perform her normal functional lifting and carrying including her baby and groceries without significant pain or difficulty.  Baseline: Pt has pain with carrying her baby and objects Target date: 11/15/2021 Goal status: INITIAL   3.  Pt will demo improved core strength by progressing with core exercises without adverse effects for improved tolerance with and performance of functional lifting and carrying.  Baseline: Pt is not performing core exercises and does not have a core stabilization program.  Target date: 11/15/2021 Goal status: INITIAL       PLAN: PT FREQUENCY: 2x/week   PT DURATION: 6 weeks   PLANNED INTERVENTIONS: Therapeutic exercises, Therapeutic activity, Neuromuscular re-education, Gait training, Patient/Family education, Joint mobilization, Aquatic Therapy, Dry Needling, Electrical stimulation, Spinal manipulation, Spinal mobilization, Cryotherapy, Moist heat, Taping, and Manual therapy   PLAN FOR NEXT SESSION: review and perform HEP.  Progress core stab's and HEP.  HS stretching.  Cont with STM to lumbar.                       Audie Clear III PT, DPT 11/08/21 8:41 AM  PHYSICAL THERAPY DISCHARGE SUMMARY  Visits from Start of Care: 3  Current functional level related to goals / functional outcomes: Pt presented to Rx denying pain and reporting that she was feeling better.  She had reduced pain with carrying objects.     Remaining deficits: See above   Education / Equipment: Pt has a HEP   Pt received PT from 10/04/2021 - 11/07/2021.  Pt had to cancel her following appointment due to not having a babysitter.  PT did not hear back from pt to reschedule.  She will be considered discharged at this time.   Audie Clear III PT, DPT 02/18/22 4:41 PM

## 2021-11-08 ENCOUNTER — Encounter (HOSPITAL_BASED_OUTPATIENT_CLINIC_OR_DEPARTMENT_OTHER): Payer: Self-pay | Admitting: Physical Therapy

## 2021-11-14 ENCOUNTER — Ambulatory Visit (HOSPITAL_BASED_OUTPATIENT_CLINIC_OR_DEPARTMENT_OTHER): Payer: Medicaid Other | Admitting: Physical Therapy

## 2021-11-22 ENCOUNTER — Ambulatory Visit (INDEPENDENT_AMBULATORY_CARE_PROVIDER_SITE_OTHER): Payer: Medicaid Other | Admitting: Family Medicine

## 2021-11-22 ENCOUNTER — Other Ambulatory Visit (HOSPITAL_BASED_OUTPATIENT_CLINIC_OR_DEPARTMENT_OTHER): Payer: Self-pay | Admitting: Family Medicine

## 2021-11-22 ENCOUNTER — Encounter (HOSPITAL_BASED_OUTPATIENT_CLINIC_OR_DEPARTMENT_OTHER): Payer: Self-pay | Admitting: Family Medicine

## 2021-11-22 VITALS — BP 107/66 | HR 67 | Temp 97.7°F | Ht <= 58 in | Wt 133.2 lb

## 2021-11-22 DIAGNOSIS — Z0000 Encounter for general adult medical examination without abnormal findings: Secondary | ICD-10-CM | POA: Diagnosis not present

## 2021-11-22 DIAGNOSIS — R350 Frequency of micturition: Secondary | ICD-10-CM | POA: Diagnosis not present

## 2021-11-22 LAB — POCT URINALYSIS DIPSTICK
Bilirubin, UA: NEGATIVE
Blood, UA: NEGATIVE
Glucose, UA: NEGATIVE
Ketones, UA: NEGATIVE
Nitrite, UA: NEGATIVE
Protein, UA: NEGATIVE
Spec Grav, UA: 1.025 (ref 1.010–1.025)
Urobilinogen, UA: 1 E.U./dL
pH, UA: 5.5 (ref 5.0–8.0)

## 2021-11-22 NOTE — Assessment & Plan Note (Signed)
Routine HCM labs reviewed. HCM reviewed/discussed. Anticipatory guidance regarding healthy weight, lifestyle and choices given. ?Recommend healthy diet.  Recommend approximately 150 minutes/week of moderate intensity exercise ?Recommend regular dental and vision exams ?Always use seatbelt/lap and shoulder restraints ?Recommend using smoke alarms and checking batteries at least twice a year ?Recommend using sunscreen when outside ?She is currently up-to-date on cervical cancer screening, will be establishing with OB/GYN next month ?Up-to-date on tetanus ?

## 2021-11-22 NOTE — Assessment & Plan Note (Signed)
She is reporting some urinary frequency recently.  Denies any dysuria or itching.  Has had prior UTIs in the past she indicates that with those prior episodes she had some burning with urination and itching which are absent at this time ?She does not have any abdominal pain, fevers, no back pain ?We will check UA today and treat accordingly.  If no evidence of infection, recommend proceeding with adequate hydration and monitoring symptoms ?

## 2021-11-22 NOTE — Patient Instructions (Signed)
Preventive Care 21-33 Years Old, Female ?Preventive care refers to lifestyle choices and visits with your health care provider that can promote health and wellness. Preventive care visits are also called wellness exams. ?What can I expect for my preventive care visit? ?Counseling ?During your preventive care visit, your health care provider may ask about your: ?Medical history, including: ?Past medical problems. ?Family medical history. ?Pregnancy history. ?Current health, including: ?Menstrual cycle. ?Method of birth control. ?Emotional well-being. ?Home life and relationship well-being. ?Sexual activity and sexual health. ?Lifestyle, including: ?Alcohol, nicotine or tobacco, and drug use. ?Access to firearms. ?Diet, exercise, and sleep habits. ?Work and work environment. ?Sunscreen use. ?Safety issues such as seatbelt and bike helmet use. ?Physical exam ?Your health care provider may check your: ?Height and weight. These may be used to calculate your BMI (body mass index). BMI is a measurement that tells if you are at a healthy weight. ?Waist circumference. This measures the distance around your waistline. This measurement also tells if you are at a healthy weight and may help predict your risk of certain diseases, such as type 2 diabetes and high blood pressure. ?Heart rate and blood pressure. ?Body temperature. ?Skin for abnormal spots. ?What immunizations do I need? ? ?Vaccines are usually given at various ages, according to a schedule. Your health care provider will recommend vaccines for you based on your age, medical history, and lifestyle or other factors, such as travel or where you work. ?What tests do I need? ?Screening ?Your health care provider may recommend screening tests for certain conditions. This may include: ?Pelvic exam and Pap test. ?Lipid and cholesterol levels. ?Diabetes screening. This is done by checking your blood sugar (glucose) after you have not eaten for a while (fasting). ?Hepatitis  B test. ?Hepatitis C test. ?HIV (human immunodeficiency virus) test. ?STI (sexually transmitted infection) testing, if you are at risk. ?BRCA-related cancer screening. This may be done if you have a family history of breast, ovarian, tubal, or peritoneal cancers. ?Talk with your health care provider about your test results, treatment options, and if necessary, the need for more tests. ?Follow these instructions at home: ?Eating and drinking ? ?Eat a healthy diet that includes fresh fruits and vegetables, whole grains, lean protein, and low-fat dairy products. ?Take vitamin and mineral supplements as recommended by your health care provider. ?Do not drink alcohol if: ?Your health care provider tells you not to drink. ?You are pregnant, may be pregnant, or are planning to become pregnant. ?If you drink alcohol: ?Limit how much you have to 0-1 drink a day. ?Know how much alcohol is in your drink. In the [place] one drink equals one 12 oz bottle of beer (355 mL), one 5 oz glass of wine (148 mL), or one 1? oz glass of hard liquor (44 mL). ?Lifestyle ?Brush your teeth every morning and night with fluoride toothpaste. Floss one time each day. ?Exercise for at least 30 minutes 5 or more days each week. ?Do not use any products that contain nicotine or tobacco. These products include cigarettes, chewing tobacco, and vaping devices, such as e-cigarettes. If you need help quitting, ask your health care provider. ?Do not use drugs. ?If you are sexually active, practice safe sex. Use a condom or other form of protection to prevent STIs. ?If you do not wish to become pregnant, use a form of birth control. If you plan to become pregnant, see your health care provider for a prepregnancy visit. ?Find healthy ways to manage stress, such as: ?Meditation,   yoga, or listening to music. ?Journaling. ?Talking to a trusted person. ?Spending time with friends and family. ?Minimize exposure to UV radiation to reduce your risk of skin  cancer. ?Safety ?Always wear your seat belt while driving or riding in a vehicle. ?Do not drive: ?If you have been drinking alcohol. Do not ride with someone who has been drinking. ?If you have been using any mind-altering substances or drugs. ?While texting. ?When you are tired or distracted. ?Wear a helmet and other protective equipment during sports activities. ?If you have firearms in your house, make sure you follow all gun safety procedures. ?Seek help if you have been physically or sexually abused. ?What's next? ?Go to your health care provider once a year for an annual wellness visit. ?Ask your health care provider how often you should have your eyes and teeth checked. ?Stay up to date on all vaccines. ?This information is not intended to replace advice given to you by your health care provider. Make sure you discuss any questions you have with your health care provider. ?Document Revised: 01/17/2021 Document Reviewed: 01/17/2021 ?Elsevier Patient Education ? [place]. ? ?

## 2021-11-22 NOTE — Progress Notes (Signed)
?Subjective:   ? ?CC: Annual Physical Exam ? ?HPI:  ?Veronica Shea is a 31 y.o. presenting for annual physical ? ?I reviewed the past medical history, family history, social history, surgical history, and allergies today and no changes were needed.  Please see the problem list section below in epic for further details. ? ?Past Medical History: ?Past Medical History:  ?Diagnosis Date  ? Genital herpes   ? ?Past Surgical History: ?Past Surgical History:  ?Procedure Laterality Date  ? NO PAST SURGERIES    ? TUBAL LIGATION Bilateral 05/24/2020  ? Procedure: POST PARTUM TUBAL LIGATION;  Surgeon: Kathrynn Running, MD;  Location: [facility] LD ORS;  Service: Gynecology;  Laterality: Bilateral;  ? ?Social History: ?Social History  ? ?Socioeconomic History  ? Marital status: Single  ?  Spouse name: Not on file  ? Number of children: Not on file  ? Years of education: Not on file  ? Highest education level: Not on file  ?Occupational History  ? Not on file  ?Tobacco Use  ? Smoking status: Never  ? Smokeless tobacco: Never  ?Vaping Use  ? Vaping Use: Never used  ?Substance and Sexual Activity  ? Alcohol use: Yes  ?  Alcohol/week: 1.0 standard drink  ?  Types: 1 Cans of beer per week  ?  Comment: Occasionally  ? Drug use: No  ? Sexual activity: Yes  ?  Birth control/protection: Surgical  ?Other Topics Concern  ? Not on file  ?Social History Narrative  ? Not on file  ? ?Social Determinants of Health  ? ?Financial Resource Strain: Not on file  ?Food Insecurity: Not on file  ?Transportation Needs: Not on file  ?Physical Activity: Not on file  ?Stress: Not on file  ?Social Connections: Not on file  ? ?Family History: ?Family History  ?Problem Relation Age of Onset  ? Hypertension Father   ? Diabetes Father   ? Diabetes Sister   ? ?Allergies: ?No Known Allergies ?Medications: See med rec. ? ?Review of Systems: No headache, visual changes, nausea, vomiting, diarrhea, constipation, dizziness, abdominal pain, skin rash, fevers,  chills, night sweats, swollen lymph nodes, weight loss, chest pain, body aches, joint swelling, muscle aches, shortness of breath, mood changes, visual or auditory hallucinations. ? ?Objective:   ? ?BP 107/66   Pulse 67   Temp 97.7 ?F (36.5 ?C)   Ht 4\' 10"  (1.473 m)   Wt 133 lb 3.2 oz (60.4 kg)   LMP 11/15/2021 (Exact Date)   SpO2 96%   Breastfeeding No   BMI 27.84 kg/m?  ? ?General: Well Developed, well nourished, and in no acute distress.  ?Neuro: Alert and oriented x3, extra-ocular muscles intact, sensation grossly intact. Cranial nerves II through XII are intact, motor, sensory, and coordinative functions are all intact. ?HEENT: Normocephalic, atraumatic, pupils equal round reactive to light, neck supple, no masses, no lymphadenopathy, thyroid nonpalpable. Oropharynx, nasopharynx, external ear canals are unremarkable. ?Skin: Warm and dry, no rashes noted.  ?Cardiac: Regular rate and rhythm, no murmurs rubs or gallops.  ?Respiratory: Clear to auscultation bilaterally. Not using accessory muscles, speaking in full sentences.  ?Abdominal: Soft, nontender, nondistended, positive bowel sounds, no masses, no organomegaly.  ?Musculoskeletal: Shoulder, elbow, wrist, hip, knee, ankle stable, and with full range of motion. ? ?Impression and Recommendations:   ? ?Wellness examination ?Routine HCM labs reviewed. HCM reviewed/discussed. Anticipatory guidance regarding healthy weight, lifestyle and choices given. ?Recommend healthy diet.  Recommend approximately 150 minutes/week of moderate intensity exercise ?Recommend regular  dental and vision exams ?Always use seatbelt/lap and shoulder restraints ?Recommend using smoke alarms and checking batteries at least twice a year ?Recommend using sunscreen when outside ?She is currently up-to-date on cervical cancer screening, will be establishing with OB/GYN next month ?Up-to-date on tetanus ? ?Urinary frequency ?She is reporting some urinary frequency recently.  Denies any  dysuria or itching.  Has had prior UTIs in the past she indicates that with those prior episodes she had some burning with urination and itching which are absent at this time ?She does not have any abdominal pain, fevers, no back pain ?We will check UA today and treat accordingly.  If no evidence of infection, recommend proceeding with adequate hydration and monitoring symptoms ? ?Plan for follow-up in 1 year for CPE or sooner as needed ? ? ?___________________________________________ ?Maili Shutters de [place], MD, ABFM, CAQSM ?Primary Care and Sports Medicine ?[facility] ?

## 2021-11-23 LAB — URINE CULTURE

## 2021-12-01 ENCOUNTER — Encounter (HOSPITAL_BASED_OUTPATIENT_CLINIC_OR_DEPARTMENT_OTHER): Payer: Self-pay | Admitting: Family Medicine

## 2021-12-14 ENCOUNTER — Ambulatory Visit (HOSPITAL_BASED_OUTPATIENT_CLINIC_OR_DEPARTMENT_OTHER): Payer: Medicaid Other | Admitting: Medical

## 2021-12-14 ENCOUNTER — Other Ambulatory Visit (HOSPITAL_COMMUNITY)
Admission: RE | Admit: 2021-12-14 | Discharge: 2021-12-14 | Disposition: A | Discharge status: Home / Self Care | Payer: Medicaid Other | Source: Ambulatory Visit | Attending: Medical | Admitting: Medical

## 2021-12-14 ENCOUNTER — Encounter (HOSPITAL_BASED_OUTPATIENT_CLINIC_OR_DEPARTMENT_OTHER): Payer: Self-pay | Admitting: Medical

## 2021-12-14 VITALS — BP 118/77 | HR 64 | Ht 59.5 in | Wt 132.0 lb

## 2021-12-14 DIAGNOSIS — Z124 Encounter for screening for malignant neoplasm of cervix: Secondary | ICD-10-CM | POA: Insufficient documentation

## 2021-12-14 DIAGNOSIS — Z113 Encounter for screening for infections with a predominantly sexual mode of transmission: Secondary | ICD-10-CM | POA: Diagnosis not present

## 2021-12-14 DIAGNOSIS — B372 Candidiasis of skin and nail: Secondary | ICD-10-CM

## 2021-12-14 DIAGNOSIS — Z7689 Persons encountering health services in other specified circumstances: Secondary | ICD-10-CM | POA: Diagnosis not present

## 2021-12-14 DIAGNOSIS — Z01419 Encounter for gynecological examination (general) (routine) without abnormal findings: Secondary | ICD-10-CM | POA: Diagnosis not present

## 2021-12-14 MED ORDER — NYSTATIN 100000 UNIT/GM EX CREA: TOPICAL_CREAM | CUTANEOUS | 0 refills | Status: DC

## 2021-12-14 NOTE — Progress Notes (Signed)
? ?History:  ?Veronica Shea is a 31 y.o. [id number] who presents to clinic today for annual exam and pap smear. Patient is new to the office but was seen at [facility] in 2021. Last pap smear available was in 2013. Patient thought she had a pap smear at another [facility] office in 2021 during her pregnancy but it was just STD testing. She has regular periods lasting 3-5 days with some cramping prior to bleeding. She denies bleeding, discharge, pain, dysuria, GI symptoms or breast issues. She has noted some itching under her breasts that comes and goes. She would like STD testing today. She is sexually active with 1 female partner and does not use condoms or birth control. She does not desire pregnancy.  ? ?The following portions of the patient's history were reviewed and updated as appropriate: allergies, current medications, family history, past medical history, social history, past surgical history and problem list. ? ?Review of Systems:  ?Review of Systems  ?Constitutional:  Negative for fever and malaise/fatigue.  ?Gastrointestinal:  Negative for abdominal pain, constipation, diarrhea, nausea and vomiting.  ?Genitourinary:  Negative for dysuria, frequency and urgency.  ?     Neg - vaginal bleeding, discharge, pelvic pain  ? ?  ?Objective:  ?Physical Exam ?BP 118/77   Pulse 64   Ht 4' 11.5" (1.511 m)   Wt 132 lb (59.9 kg)   LMP 12/09/2021 (Exact Date)   Breastfeeding No   BMI 26.21 kg/m?  ?Physical Exam ?Vitals and nursing note reviewed. Exam conducted with a chaperone present.  ?Constitutional:   ?   General: She is not in acute distress. ?   Appearance: Normal appearance. She is well-developed and normal weight.  ?HENT:  ?   Head: Normocephalic and atraumatic.  ?Neck:  ?   Thyroid: No thyromegaly.  ?Cardiovascular:  ?   Rate and Rhythm: Normal rate and regular rhythm.  ?   Heart sounds: No murmur heard. ?Pulmonary:  ?   Effort: Pulmonary effort is normal. No respiratory distress.  ?   Breath sounds:  Normal breath sounds. No wheezing.  ?Chest:  ?Breasts: ?   Right: Normal.  ?   Left: Normal.  ? ? ?Abdominal:  ?   General: Abdomen is flat. Bowel sounds are normal. There is no distension.  ?   Palpations: Abdomen is soft. There is no mass.  ?   Tenderness: There is no abdominal tenderness. There is no guarding or rebound.  ?Genitourinary: ?   General: Normal vulva.  ?   Vagina: No vaginal discharge, erythema, tenderness or bleeding.  ?   Cervix: Friability and cervical bleeding (small following pap smear) present. No cervical motion tenderness, discharge, lesion or erythema.  ?   Uterus: Not enlarged and not tender.   ?   Adnexa:     ?   Right: No mass or tenderness.      ?   Left: No mass or tenderness.    ?Musculoskeletal:  ?   Cervical back: Neck supple.  ?Skin: ?   General: Skin is warm and dry.  ?   Findings: No erythema.  ?Neurological:  ?   Mental Status: She is alert and oriented to person, place, and time.  ?Psychiatric:     ?   Mood and Affect: Mood normal.  ? ? ?Health Maintenance Due  ?Topic Date Due  ? COVID-19 Vaccine (3 - Booster for Pfizer series) 03/25/2020  ? ? ?Assessment & Plan:  ?1. Women's annual routine gynecological  examination ? ?2. Encounter to establish care ? ?3. Cervical cancer screening ?- Cytology - PAP( [place]) ? ?4. Screen for STD (sexually transmitted disease) ?- Cytology - PAP( [place]) ?- Hepatitis C antibody ?- Hepatitis B surface antigen ?- RPR ?- HIV Antibody (routine testing w rflx) ? ?5. Cutaneous yeast ?- Rx Nystatin sent to patient's pharmacy  ? ?Results will be available to patient in MyChart ?If pap smear is normal she will not need pap smear until 12/2024 ?Return to [facility] in 1 year or sooner PRN ? ? ?Marny Lowenstein, PA-C ?12/14/2021 ?11:11 AM ? ?

## 2021-12-15 LAB — HEPATITIS C ANTIBODY: Hep C Virus Ab: NONREACTIVE

## 2021-12-15 LAB — HEPATITIS B SURFACE ANTIGEN: Hepatitis B Surface Ag: NEGATIVE

## 2021-12-15 LAB — RPR: RPR Ser Ql: NONREACTIVE

## 2021-12-15 LAB — HIV ANTIBODY (ROUTINE TESTING W REFLEX): HIV Screen 4th Generation wRfx: NONREACTIVE

## 2021-12-17 ENCOUNTER — Encounter (HOSPITAL_BASED_OUTPATIENT_CLINIC_OR_DEPARTMENT_OTHER): Payer: Self-pay

## 2021-12-20 LAB — CYTOLOGY - PAP
Chlamydia: NEGATIVE
Comment: NEGATIVE
Comment: NEGATIVE
Comment: NORMAL
Diagnosis: NEGATIVE
Diagnosis: REACTIVE
High risk HPV: NEGATIVE
Neisseria Gonorrhea: NEGATIVE

## 2022-01-09 ENCOUNTER — Encounter (HOSPITAL_BASED_OUTPATIENT_CLINIC_OR_DEPARTMENT_OTHER): Payer: Self-pay

## 2022-01-17 ENCOUNTER — Encounter (HOSPITAL_BASED_OUTPATIENT_CLINIC_OR_DEPARTMENT_OTHER): Payer: Self-pay | Admitting: Family Medicine

## 2022-01-17 ENCOUNTER — Ambulatory Visit (HOSPITAL_BASED_OUTPATIENT_CLINIC_OR_DEPARTMENT_OTHER): Payer: Medicaid Other | Admitting: Family Medicine

## 2022-01-17 VITALS — BP 127/82 | HR 79 | Ht 59.0 in | Wt 134.6 lb

## 2022-01-17 DIAGNOSIS — Z8619 Personal history of other infectious and parasitic diseases: Secondary | ICD-10-CM | POA: Diagnosis not present

## 2022-01-17 MED ORDER — VALACYCLOVIR HCL 500 MG PO TABS: 500.0000 mg | ORAL_TABLET | Freq: Two times a day (BID) | ORAL | 0 refills | Status: AC

## 2022-01-17 NOTE — Progress Notes (Signed)
    Procedures performed today:    None.  Independent interpretation of notes and tests performed by another provider:   None.  Brief History, Exam, Impression, and Recommendations:    BP 127/82   Pulse 79   Ht 4\' 11"  (1.499 m)   Wt 134 lb 9.6 oz (61.1 kg)   SpO2 100%   BMI 27.19 kg/m   History of herpes genitalis Patient reports that about 6 days ago, she began to have symptoms consistent with herpes outbreak.  Indicates that she has had a history of outbreaks in the past and most recent episode felt consistent with prior episodes.  She began to have some tingling, pain, burning, itching.  Symptoms persisted for a few days, mostly resolved by yesterday, no symptoms today.  In the past she has used valacyclovir twice daily during episodes to help with shortening duration and decreasing severity.  Given that current episode has resolved, she does not feel that she needs treatment at this time, however would be interested in having prescription of valacyclovir available to be used with next outbreak.  Last outbreak prior to this most recent 1 was about 1.5 years ago.  Given relative infrequency of outbreaks, feel that episodic therapy is reasonable and allowing patient to have prescription at home for valacyclovir to use during next outbreak is also reasonable.  Prescription has been sent to pharmacy on file today.  We will continue with monitoring.  Return if symptoms worsen or fail to improve.  Patient does have CPE scheduled in about 10 months.   ___________________________________________ Jesse Hirst de , MD, ABFM, CAQSM Primary Care and Sports Medicine [facility]

## 2022-01-17 NOTE — Assessment & Plan Note (Signed)
Patient reports that about 6 days ago, she began to have symptoms consistent with herpes outbreak.  Indicates that she has had a history of outbreaks in the past and most recent episode felt consistent with prior episodes.  She began to have some tingling, pain, burning, itching.  Symptoms persisted for a few days, mostly resolved by yesterday, no symptoms today.  In the past she has used valacyclovir twice daily during episodes to help with shortening duration and decreasing severity.  Given that current episode has resolved, she does not feel that she needs treatment at this time, however would be interested in having prescription of valacyclovir available to be used with next outbreak.  Last outbreak prior to this most recent 1 was about 1.5 years ago.  Given relative infrequency of outbreaks, feel that episodic therapy is reasonable and allowing patient to have prescription at home for valacyclovir to use during next outbreak is also reasonable.  Prescription has been sent to pharmacy on file today.  We will continue with monitoring.

## 2022-04-22 ENCOUNTER — Encounter (HOSPITAL_BASED_OUTPATIENT_CLINIC_OR_DEPARTMENT_OTHER): Payer: Self-pay | Admitting: Family Medicine

## 2022-04-22 MED ORDER — VALACYCLOVIR HCL 500 MG PO TABS: 500.0000 mg | ORAL_TABLET | Freq: Two times a day (BID) | ORAL | 1 refills | Status: DC

## 2022-05-08 ENCOUNTER — Encounter (HOSPITAL_BASED_OUTPATIENT_CLINIC_OR_DEPARTMENT_OTHER): Payer: Self-pay

## 2022-05-09 ENCOUNTER — Other Ambulatory Visit (HOSPITAL_COMMUNITY)
Admission: RE | Admit: 2022-05-09 | Discharge: 2022-05-09 | Disposition: A | Discharge status: Home / Self Care | Payer: Medicaid Other | Source: Ambulatory Visit | Attending: Obstetrics & Gynecology | Admitting: Obstetrics & Gynecology

## 2022-05-09 ENCOUNTER — Encounter (HOSPITAL_BASED_OUTPATIENT_CLINIC_OR_DEPARTMENT_OTHER): Payer: Self-pay | Admitting: Obstetrics & Gynecology

## 2022-05-09 ENCOUNTER — Ambulatory Visit (HOSPITAL_BASED_OUTPATIENT_CLINIC_OR_DEPARTMENT_OTHER): Payer: Medicaid Other | Admitting: Obstetrics & Gynecology

## 2022-05-09 VITALS — BP 111/69 | HR 59 | Ht 59.0 in | Wt 133.6 lb

## 2022-05-09 DIAGNOSIS — R102 Pelvic and perineal pain: Secondary | ICD-10-CM | POA: Diagnosis not present

## 2022-05-09 DIAGNOSIS — K5900 Constipation, unspecified: Secondary | ICD-10-CM | POA: Diagnosis not present

## 2022-05-09 DIAGNOSIS — N898 Other specified noninflammatory disorders of vagina: Secondary | ICD-10-CM | POA: Insufficient documentation

## 2022-05-09 DIAGNOSIS — Z23 Encounter for immunization: Secondary | ICD-10-CM | POA: Diagnosis not present

## 2022-05-09 LAB — CBC WITH DIFFERENTIAL/PLATELET
Basophils Absolute: 0 10*3/uL (ref 0.0–0.2)
Basos: 0 %
EOS (ABSOLUTE): 0.1 10*3/uL (ref 0.0–0.4)
Eos: 1 %
Hematocrit: 42.4 % (ref 34.0–46.6)
Hemoglobin: 14.3 g/dL (ref 11.1–15.9)
Immature Grans (Abs): 0 10*3/uL (ref 0.0–0.1)
Immature Granulocytes: 0 %
Lymphocytes Absolute: 3.3 10*3/uL — ABNORMAL HIGH (ref 0.7–3.1)
Lymphs: 42 %
MCH: 29.3 pg (ref 26.6–33.0)
MCHC: 33.7 g/dL (ref 31.5–35.7)
MCV: 87 fL (ref 79–97)
Monocytes Absolute: 0.4 10*3/uL (ref 0.1–0.9)
Monocytes: 5 %
Neutrophils Absolute: 4 10*3/uL (ref 1.4–7.0)
Neutrophils: 52 %
Platelets: 341 10*3/uL (ref 150–450)
RBC: 4.88 x10E6/uL (ref 3.77–5.28)
RDW: 13.5 % (ref 11.7–15.4)
WBC: 7.9 10*3/uL (ref 3.4–10.8)

## 2022-05-09 MED ORDER — METRONIDAZOLE 500 MG PO TABS: 500.0000 mg | ORAL_TABLET | Freq: Two times a day (BID) | ORAL | 0 refills | Status: DC

## 2022-05-09 MED ORDER — CEFTRIAXONE SODIUM 500 MG IJ SOLR
500.0000 mg | Freq: Once | INTRAMUSCULAR | Status: AC
  Administered 2022-05-09: 500 mg via INTRAMUSCULAR

## 2022-05-09 MED ORDER — DOXYCYCLINE HYCLATE 100 MG PO CAPS: 100.0000 mg | ORAL_CAPSULE | Freq: Two times a day (BID) | ORAL | 0 refills | Status: DC

## 2022-05-09 NOTE — Progress Notes (Signed)
GYNECOLOGY  VISIT  CC:   LLQ pain  HPI: 31 y.o. [id number] Single White or Caucasian female here for for about three weeks of LLQ pain.  Does not typically have pain.  Denies urinary symptoms.  Denies fever.  Typically has a daily BM but the last few weeks this has been less frequent. Has had more straining with bowel movements.  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  Last menstrual cycle about a week ago.  Flow lasted 2 1/2 to 3 days.  Flow is moderate and she does not typically have much cramping.  She did have more cramping.     Past Medical History:  Diagnosis Date   Genital herpes     MEDS:   Current Outpatient Medications on File Prior to Visit  Medication Sig Dispense Refill   nystatin cream (MYCOSTATIN) Apply to affected area 2 times daily 15 g 0   valACYclovir (VALTREX) 500 MG tablet Take 1 tablet (500 mg total) by mouth 2 (two) times daily. 6 tablet 1   No current facility-administered medications on file prior to visit.    ALLERGIES: Patient has no known allergies.  SH:  has significant other and together for many years, smoke cigarettes 1 every other day  Review of Systems  Constitutional: Negative.   Genitourinary:        LLQ pain    PHYSICAL EXAMINATION:    BP 111/69 (BP Location: Left Arm, Patient Position: Sitting, Cuff Size: Normal)   Pulse (!) 59   Ht 4\' 11"  (1.499 m)   Wt 133 lb 9.6 oz (60.6 kg)   LMP 04/29/2022 (Approximate)   BMI 26.98 kg/m     General appearance: alert, cooperative and appears stated age Abdomen: soft, LLQ tenderness present, non surgical abdomen, bowel sounds normal; no masses,  no organomegaly Lymph:  no inguinal LAD noted  Pelvic: External genitalia:  no lesions              Urethra:  normal appearing urethra with no masses, tenderness or lesions              Bartholins and Skenes: normal                 Vagina: normal appearing vagina with malodorous frothy vaginal discharge              Cervix: no lesions               Bimanual Exam:  Uterus:   normal sized and mobile but CMT present              Adnexa:  no masses present, tenderness with palpation of left adnexa present  Chaperone, 05/01/2022, RN, was present for exam.  Assessment/Plan: 1. Pelvic pain - findings on exam with discharge and CMT concerning for PID so will treat for this until testing resutls - metroNIDAZOLE (FLAGYL) 500 MG tablet; Take 1 tablet (500 mg total) by mouth 2 (two) times daily.  Dispense: 28 tablet; Refill: 0 - doxycycline (VIBRAMYCIN) 100 MG capsule; Take 1 capsule (100 mg total) by mouth 2 (two) times daily. Take with food as can cause GI distress.  Dispense: 28 capsule; Refill: 0 - cefTRIAXone (ROCEPHIN) injection 500 mg - CBC with Differential/Platelet - pt will probably benefit from ultrasound as well but will wait until vaginitis testing has resulted.  2. Influenza vaccination given - Flu Vaccine QUAD 36+ mos IM (Fluarix, Quad PF)  3. Vaginal discharge - testing for yeast, BV, trich,  GC and Chl  4. Vaginal odor  5.  Constipation

## 2022-05-10 LAB — CERVICOVAGINAL ANCILLARY ONLY
Bacterial Vaginitis (gardnerella): POSITIVE — AB
Candida Glabrata: NEGATIVE
Candida Vaginitis: NEGATIVE
Chlamydia: NEGATIVE
Comment: NEGATIVE
Comment: NEGATIVE
Comment: NEGATIVE
Comment: NEGATIVE
Comment: NEGATIVE
Comment: NORMAL
Neisseria Gonorrhea: NEGATIVE
Trichomonas: NEGATIVE

## 2022-08-01 ENCOUNTER — Ambulatory Visit (INDEPENDENT_AMBULATORY_CARE_PROVIDER_SITE_OTHER): Payer: Medicaid Other | Admitting: *Deleted

## 2022-08-01 ENCOUNTER — Other Ambulatory Visit (HOSPITAL_COMMUNITY)
Admission: RE | Admit: 2022-08-01 | Discharge: 2022-08-01 | Disposition: A | Discharge status: Home / Self Care | Payer: Medicaid Other | Source: Ambulatory Visit | Attending: Obstetrics & Gynecology | Admitting: Obstetrics & Gynecology

## 2022-08-01 DIAGNOSIS — R319 Hematuria, unspecified: Secondary | ICD-10-CM | POA: Diagnosis not present

## 2022-08-01 DIAGNOSIS — N898 Other specified noninflammatory disorders of vagina: Secondary | ICD-10-CM | POA: Insufficient documentation

## 2022-08-01 DIAGNOSIS — R300 Dysuria: Secondary | ICD-10-CM | POA: Diagnosis not present

## 2022-08-01 LAB — POCT URINALYSIS DIPSTICK
Appearance: NORMAL
Bilirubin, UA: NEGATIVE
Glucose, UA: NEGATIVE
Ketones, UA: NEGATIVE
Nitrite, UA: NEGATIVE
Protein, UA: NEGATIVE
Spec Grav, UA: 1.025 (ref 1.010–1.025)
Urobilinogen, UA: 0.2 E.U./dL
pH, UA: 5.5 (ref 5.0–8.0)

## 2022-08-01 NOTE — Progress Notes (Signed)
Pt presents to office with complaints of vaginal burning, burning with urination and vaginal itching. Pt instructed on and self swab obtained. Clean catch urine obtained. Advised that we would notify her once tests have resulted.

## 2022-08-02 ENCOUNTER — Other Ambulatory Visit (HOSPITAL_BASED_OUTPATIENT_CLINIC_OR_DEPARTMENT_OTHER): Payer: Self-pay | Admitting: Obstetrics & Gynecology

## 2022-08-02 DIAGNOSIS — R102 Pelvic and perineal pain: Secondary | ICD-10-CM

## 2022-08-02 DIAGNOSIS — B9689 Other specified bacterial agents as the cause of diseases classified elsewhere: Secondary | ICD-10-CM

## 2022-08-02 LAB — CERVICOVAGINAL ANCILLARY ONLY
Bacterial Vaginitis (gardnerella): POSITIVE — AB
Candida Glabrata: NEGATIVE
Candida Vaginitis: NEGATIVE
Chlamydia: NEGATIVE
Comment: NEGATIVE
Comment: NEGATIVE
Comment: NEGATIVE
Comment: NEGATIVE
Comment: NEGATIVE
Comment: NORMAL
Neisseria Gonorrhea: NEGATIVE
Trichomonas: NEGATIVE

## 2022-08-02 LAB — URINE CULTURE

## 2022-08-02 MED ORDER — METRONIDAZOLE 500 MG PO TABS: 500.0000 mg | ORAL_TABLET | Freq: Two times a day (BID) | ORAL | 0 refills | Status: DC

## 2022-10-24 ENCOUNTER — Ambulatory Visit (HOSPITAL_BASED_OUTPATIENT_CLINIC_OR_DEPARTMENT_OTHER): Payer: Medicaid Other | Admitting: Family Medicine

## 2022-10-24 VITALS — BP 114/65 | HR 65 | Ht 59.0 in | Wt 128.8 lb

## 2022-10-24 DIAGNOSIS — G43709 Chronic migraine without aura, not intractable, without status migrainosus: Secondary | ICD-10-CM | POA: Diagnosis not present

## 2022-10-24 MED ORDER — SUMATRIPTAN SUCCINATE 50 MG PO TABS: 50.0000 mg | ORAL_TABLET | Freq: Once | ORAL | 0 refills | Status: DC | PRN

## 2022-10-24 MED ORDER — ONDANSETRON HCL 4 MG PO TABS: 4.0000 mg | ORAL_TABLET | Freq: Three times a day (TID) | ORAL | 0 refills | Status: DC | PRN

## 2022-10-24 NOTE — Assessment & Plan Note (Deleted)
Patient presents today with 4 days with an ongoing headache. She reports unilateral, moderate pain on the right side of her head that is throbbing/pulsating. She reports associated nausea and phonophobia.  Patient reports she has had this type of headache in the past.  Patient has taken Excedrin with relief but reports that this headache is persistent.  She meets the criteria for migraine without aura.  Normal neurological exam.  Vital signs within normal range.  No concerning red flags. Provided education regarding abortive therapy being used at the first sign of a migraine and to take in combination with NSAIDs. Provided patient with anti-nausea medication (zofran 4mg  Q8 PRN). Neurology referral or imaging not warranted at this time. Advised patient to continue with abortive treatment and to return if she notices symptoms such as an increase in headache frequency/quality/pattern, "worst headache" of her life, difficulties with speech or gait, or any other acute symptoms. Patient verbalized understanding and agreed to plan.

## 2022-10-24 NOTE — Progress Notes (Signed)
Acute Office Visit  Subjective:     Patient ID: Veronica Shea, female    DOB: Jan 05, 1991, 32 y.o.   MRN: [id number]  Chief Complaint  Patient presents with   Headache    Ongoing for about 4 days, tried Excedrin & Tylenol with no relief. Right sided pain    32 yo female presents today for complaints of an ongoing headache x4 days. Reports associated nausea and phonophobia. Denies photophobia, vomiting, tearing of eyes, sinus pain/pressure, personal stressors, relation to menstrual cycle. Denies any red flags, including: fever, neck pain/stiffness, vision or speech difficulties, weakness, altered mental status, trauma, new type of headache, numbness/tingling, gait abnormalities. She reports that she has a history of migraines but they have not occurred for this duration before. Reports negative home pregnancy test.   Location: R side of head  Quality: throbbing pain on the right side of her head Frequency: comes and goes  Aggravating/Precipitating factors: heat  Relieving/Prior treatment: Excedrin, feels relief but reports that when she has taken it before she has not experienced a headache afterwards  Review of Systems  Constitutional:  Negative for chills and fever.  HENT:  Negative for hearing loss, sinus pain and tinnitus.   Eyes:  Negative for blurred vision, double vision and photophobia.  Respiratory:  Negative for shortness of breath.   Cardiovascular:  Negative for chest pain.  Gastrointestinal:  Positive for nausea. Negative for abdominal pain and vomiting.  Skin:  Negative for rash.  Neurological:  Positive for headaches. Negative for dizziness, tingling, sensory change, speech change and weakness.     Objective:    BP 114/65   Pulse 65   Ht 4\' 11"  (1.499 m)   Wt 128 lb 12.8 oz (58.4 kg)   LMP 10/21/2022 (Exact Date)   BMI 26.01 kg/m   Physical Exam Constitutional:      Appearance: Normal appearance.  Eyes:     General: No visual field deficit.     Extraocular Movements: Extraocular movements intact.     Pupils: Pupils are equal, round, and reactive to light.  Cardiovascular:     Rate and Rhythm: Normal rate and regular rhythm.     Heart sounds: Normal heart sounds.  Neurological:     General: No focal deficit present.     Mental Status: She is alert.     Cranial Nerves: Cranial nerves 2-12 are intact. No dysarthria or facial asymmetry.     Sensory: Sensation is intact. No sensory deficit.     Motor: Motor function is intact. No weakness.     Coordination: Coordination is intact. Romberg sign negative. Coordination normal.     Gait: Gait is intact. Gait normal.  Psychiatric:        Mood and Affect: Mood normal.        Speech: Speech normal.        Behavior: Behavior normal.        Assessment & Plan:  1. Chronic migraine without aura without status migrainosus, not intractable Patient presents today with 4 days with an ongoing headache. She reports unilateral, moderate pain on the right side of her head that is throbbing/pulsating. She reports associated nausea and phonophobia.  Patient reports she has had this type of headache in the past.  Patient has taken Excedrin with relief but reports that this headache is persistent.  She meets the criteria for migraine without aura.  Normal neurological exam.  Vital signs within normal range. No HTN.  No concerning red flags.  Provided education regarding abortive therapy being used at the first sign of a migraine and to take in combination with NSAIDs. Advised patient to take 50mg  sumatriptan at first sign of a migraine and then take another 50mg  if no improvement after 2 hours. Educated patient not to take more than 100mg  in 24 hours. Provided patient with anti-nausea medication (zofran 4mg  Q8 PRN). Neurology referral or imaging not warranted at this time. Advised patient to continue with abortive treatment and to return if she notices symptoms such as an increase in headache  frequency/quality/pattern, "worst headache" of her life, difficulties with speech or gait, or any other acute symptoms. Patient verbalized understanding and agreed to plan.   - SUMAtriptan (IMITREX) 50 MG tablet; Take 1 tablet (50 mg total) by mouth once as needed for up to 1 dose for migraine (Take 50mg  at onset of headache, take another 50mg  in 2 hours if still experiencing headache. Do not take more than 100mg  in 24 hours). May repeat in 2 hours if headache persists or recurs.  Dispense: 15 tablet; Refill: 0 - ondansetron (ZOFRAN) 4 MG tablet; Take 1 tablet (4 mg total) by mouth every 8 (eight) hours as needed for nausea or vomiting.  Dispense: 20 tablet; Refill: 0   Return if symptoms worsen or fail to improve.  Les Pou, FNP

## 2022-11-26 ENCOUNTER — Encounter (HOSPITAL_BASED_OUTPATIENT_CLINIC_OR_DEPARTMENT_OTHER): Payer: Medicaid Other | Admitting: Family Medicine

## 2023-01-14 ENCOUNTER — Ambulatory Visit (HOSPITAL_BASED_OUTPATIENT_CLINIC_OR_DEPARTMENT_OTHER): Payer: Medicaid Other | Admitting: Family Medicine

## 2023-01-14 ENCOUNTER — Encounter (HOSPITAL_BASED_OUTPATIENT_CLINIC_OR_DEPARTMENT_OTHER): Payer: Self-pay | Admitting: Family Medicine

## 2023-01-14 VITALS — BP 114/79 | HR 70 | Ht 59.0 in | Wt 126.0 lb

## 2023-01-14 DIAGNOSIS — R051 Acute cough: Secondary | ICD-10-CM

## 2023-01-14 DIAGNOSIS — R059 Cough, unspecified: Secondary | ICD-10-CM | POA: Insufficient documentation

## 2023-01-14 MED ORDER — BENZONATATE 200 MG PO CAPS: 200.0000 mg | ORAL_CAPSULE | Freq: Three times a day (TID) | ORAL | 0 refills | Status: DC | PRN

## 2023-01-14 NOTE — Patient Instructions (Signed)
  Medication Instructions:  Your physician recommends that you continue on your current medications as directed. Please refer to the Current Medication list given to you today. --If you need a refill on any your medications before your next appointment, please call your pharmacy first. If no refills are authorized on file call the office.-- Lab Work: Your physician has recommended that you have lab work today: No If you have labs (blood work) drawn today and your tests are completely normal, you will receive your results via MyChart message OR a phone call from our staff.  Please ensure you check your voicemail in the event that you authorized detailed messages to be left on a delegated number. If you have any lab test that is abnormal or we need to change your treatment, we will call you to review the results.  Referrals/Procedures/Imaging: Yes  Follow-Up: Your next appointment:   Your physician recommends that you schedule a follow-up appointment as needed with Dr. de Cuba.  You will receive a text message or e-mail with a link to a survey about your care and experience with us today! We would greatly appreciate your feedback!   Thanks for letting us be apart of your health journey!!  Primary Care and Sports Medicine   Dr. Raymond de Cuba   We encourage you to activate your patient portal called "MyChart".  Sign up information is provided on this After Visit Summary.  MyChart is used to connect with patients for Virtual Visits (Telemedicine).  Patients are able to view lab/test results, encounter notes, upcoming appointments, etc.  Non-urgent messages can be sent to your provider as well. To learn more about what you can do with MyChart, please visit --  [email or web address].    

## 2023-01-14 NOTE — Progress Notes (Signed)
    Procedures performed today:    None.  Independent interpretation of notes and tests performed by another provider:   None.  Brief History, Exam, Impression, and Recommendations:    BP 114/79 (BP Location: Right Arm, Patient Position: Sitting, Cuff Size: Normal)   Pulse 70   Ht 4\' 11"  (1.499 m)   Wt 126 lb (57.2 kg)   SpO2 98%   BMI 25.45 kg/m   Cough Present for about 2 weeks.  Possible mild sinus symptoms at time of onset, however at this time, no other symptoms reported.  No issues with fever, chills, sweats.  She has not tried much in regards to OTC treatments to help with cough.  Not aware of any sick contacts.  Eating and drinking without issue. On exam, patient is in no acute distress, vital signs stable, patient is afebrile.  Cardiovascular exam with regular rate and rhythm, lungs clear to auscultation bilaterally. Discussed likely etiology of postviral cough, discussed recommended treatment options.  Can proceed with use of Tessalon Perles to assist with control of cough as needed.  Discussed that symptoms can persist for up to 4 to 6 weeks after initial onset.  Could consider getting chest x-ray, particularly if cough does persist beyond expected timeframe.  Order has been placed for this for patient to obtain as needed.  Can hold off on imaging for now. Recommend returning to the office should symptoms not resolve over the next 2 to 4 weeks as expected  Return if symptoms worsen or fail to improve.   ___________________________________________ Mort Smelser de [place], MD, ABFM, [facility] Primary Care and Sports Medicine [facility]

## 2023-01-24 NOTE — Assessment & Plan Note (Signed)
Present for about 2 weeks.  Possible mild sinus symptoms at time of onset, however at this time, no other symptoms reported.  No issues with fever, chills, sweats.  She has not tried much in regards to OTC treatments to help with cough.  Not aware of any sick contacts.  Eating and drinking without issue. On exam, patient is in no acute distress, vital signs stable, patient is afebrile.  Cardiovascular exam with regular rate and rhythm, lungs clear to auscultation bilaterally. Discussed likely etiology of postviral cough, discussed recommended treatment options.  Can proceed with use of Tessalon Perles to assist with control of cough as needed.  Discussed that symptoms can persist for up to 4 to 6 weeks after initial onset.  Could consider getting chest x-ray, particularly if cough does persist beyond expected timeframe.  Order has been placed for this for patient to obtain as needed.  Can hold off on imaging for now. Recommend returning to the office should symptoms not resolve over the next 2 to 4 weeks as expected

## 2023-03-24 ENCOUNTER — Encounter (HOSPITAL_BASED_OUTPATIENT_CLINIC_OR_DEPARTMENT_OTHER): Payer: Self-pay | Admitting: Family Medicine

## 2023-03-24 ENCOUNTER — Other Ambulatory Visit (HOSPITAL_BASED_OUTPATIENT_CLINIC_OR_DEPARTMENT_OTHER): Payer: Self-pay

## 2023-03-24 MED ORDER — VALACYCLOVIR HCL 500 MG PO TABS: 500.0000 mg | ORAL_TABLET | Freq: Two times a day (BID) | ORAL | 1 refills | Status: DC

## 2023-06-26 ENCOUNTER — Encounter (HOSPITAL_BASED_OUTPATIENT_CLINIC_OR_DEPARTMENT_OTHER): Payer: Self-pay | Admitting: Family Medicine

## 2023-07-08 ENCOUNTER — Ambulatory Visit (HOSPITAL_BASED_OUTPATIENT_CLINIC_OR_DEPARTMENT_OTHER): Payer: Medicaid Other | Admitting: Family Medicine

## 2023-07-10 ENCOUNTER — Ambulatory Visit (HOSPITAL_BASED_OUTPATIENT_CLINIC_OR_DEPARTMENT_OTHER): Payer: Medicaid Other | Admitting: Family Medicine

## 2023-07-10 VITALS — BP 112/72 | HR 68 | Temp 98.0°F | Ht <= 58 in | Wt 130.0 lb

## 2023-07-10 DIAGNOSIS — N6452 Nipple discharge: Secondary | ICD-10-CM | POA: Diagnosis not present

## 2023-07-10 NOTE — Progress Notes (Signed)
   Acute Office Visit  Subjective:     Patient ID: Veronica Shea, female    DOB: 03-Aug-1991, 32 y.o.   MRN: [id number]  Chief Complaint  Patient presents with   Acute Visit    Discharge and pain on both breast with stainless steel nipple piercing x 2 weeks   Veronica Shea is a 32 year old female patient who presents today for concerns regarding discharge and pain x2 weeks from her nipple piercings. She has had these piercings for the past 2 years. She often has greenish discharge that will dry up and turn yellow, with stainless steel nipple bars. Slight odor with discharge  She has not had any discharge since Sunday after changing her piercings to plastic.  Denies systemic symptoms of infection.   Review of Systems  Constitutional:  Negative for chills, fever and malaise/fatigue.  Respiratory:  Negative for cough and shortness of breath.   Cardiovascular:  Negative for chest pain, palpitations and leg swelling.  Gastrointestinal:  Negative for abdominal pain, nausea and vomiting.  Musculoskeletal:  Negative for myalgias.  Skin:  Negative for itching and rash.  Psychiatric/Behavioral:  Negative for suicidal ideas. The patient is not nervous/anxious.        Objective:    BP 112/72   Pulse 68   Temp 98 F (36.7 C) (Oral)   Ht 4\' 10"  (1.473 m)   Wt 130 lb (59 kg)   SpO2 100%   BMI 27.17 kg/m   Physical Exam Vitals reviewed. Exam conducted with a chaperone present.  Constitutional:      Appearance: Normal appearance.  Cardiovascular:     Rate and Rhythm: Normal rate and regular rhythm.     Pulses: Normal pulses.     Heart sounds: Normal heart sounds.  Pulmonary:     Effort: Pulmonary effort is normal.     Breath sounds: Normal breath sounds.  Chest:  Breasts:    Tanner Score is 5.     Right: Normal. No swelling, bleeding, inverted nipple, nipple discharge, skin change or tenderness.     Left: Normal. No swelling, bleeding, inverted nipple, nipple  discharge, skin change or tenderness.  Lymphadenopathy:     Upper Body:     Right upper body: No supraclavicular or axillary adenopathy.     Left upper body: No supraclavicular or axillary adenopathy.  Neurological:     Mental Status: She is alert.  Psychiatric:        Mood and Affect: Mood normal.        Behavior: Behavior normal.    Assessment & Plan:   1. Nipple discharge in female Patient is a pleasant 32 year old female patient who presents for acute concerns regarding nipple discharge and pain x2 weeks. Patient reports green/yellow discharge is present when she has her steel piercings in. She changed them on Sunday to plastic and reports the discharge stopped. Vital signs stable. Chaperone Jola Schmidt, CMA) present for physical exam. No nipple discharge or tenderness present to palpation. Piercing site is clean without signs of erythema, swelling or discharge. No lymphadenopathy present. Counseled patient about avoiding stainless steel piercings. Advised patient to return to office if she develops systemic symptoms of infection or if discharge returns. No indication for antibiotics today.   Return if symptoms worsen or fail to improve.  Alyson Reedy, FNP

## 2023-08-28 ENCOUNTER — Ambulatory Visit (HOSPITAL_BASED_OUTPATIENT_CLINIC_OR_DEPARTMENT_OTHER): Payer: Medicaid Other | Admitting: Family Medicine

## 2023-09-25 ENCOUNTER — Ambulatory Visit (HOSPITAL_BASED_OUTPATIENT_CLINIC_OR_DEPARTMENT_OTHER): Payer: Medicaid Other | Admitting: Family Medicine

## 2023-11-18 DIAGNOSIS — H5213 Myopia, bilateral: Secondary | ICD-10-CM | POA: Diagnosis not present

## 2023-11-22 ENCOUNTER — Encounter (HOSPITAL_BASED_OUTPATIENT_CLINIC_OR_DEPARTMENT_OTHER): Payer: Self-pay | Admitting: Family Medicine

## 2023-11-24 ENCOUNTER — Other Ambulatory Visit (HOSPITAL_BASED_OUTPATIENT_CLINIC_OR_DEPARTMENT_OTHER): Payer: Self-pay | Admitting: Family Medicine

## 2023-11-24 DIAGNOSIS — G43709 Chronic migraine without aura, not intractable, without status migrainosus: Secondary | ICD-10-CM

## 2023-11-24 NOTE — Telephone Encounter (Unsigned)
 Copied from CRM [id number]. Topic: Clinical - Medication Refill >> Nov 24, 2023  3:30 PM [place] B wrote: Most Recent Primary Care Visit:  Provider: Wilhelmena Hanson  Department: [facility]  Visit Type: OFFICE VISIT  Date: 07/10/2023  Medication: valACYclovir  (VALTREX ) 500 MG tablet  Has the patient contacted their pharmacy? yes (Agent: If no, request that the patient contact the pharmacy for the refill. If patient does not wish to contact the pharmacy document the reason why and proceed with request.) (Agent: If yes, when and what did the pharmacy advise?)  Is this the correct pharmacy for this prescription? yes  This is the patient's preferred pharmacy:  [id number] - [place],  - [address] AT [facility] [address] [place] [address] Phone: [phone] Fax: [phone]    Has the prescription been filled recently? no  Is the patient out of the medication? yes  Has the patient been seen for an appointment in the last year OR does the patient have an upcoming appointment? yes  Can we respond through MyChart? yes  Agent: Please be advised that Rx refills may take up to 3 business days. We ask that you follow-up with your pharmacy.

## 2023-11-25 ENCOUNTER — Other Ambulatory Visit (HOSPITAL_BASED_OUTPATIENT_CLINIC_OR_DEPARTMENT_OTHER): Payer: Self-pay | Admitting: Family Medicine

## 2023-11-25 ENCOUNTER — Encounter (HOSPITAL_BASED_OUTPATIENT_CLINIC_OR_DEPARTMENT_OTHER): Payer: Self-pay | Admitting: *Deleted

## 2023-11-25 DIAGNOSIS — Z0000 Encounter for general adult medical examination without abnormal findings: Secondary | ICD-10-CM

## 2023-11-25 MED ORDER — VALACYCLOVIR HCL 500 MG PO TABS: 500.0000 mg | ORAL_TABLET | Freq: Two times a day (BID) | ORAL | 3 refills | Status: DC

## 2023-11-25 MED ORDER — SUMATRIPTAN SUCCINATE 50 MG PO TABS: 50.0000 mg | ORAL_TABLET | Freq: Once | ORAL | 0 refills | Status: DC | PRN

## 2023-11-25 NOTE — Telephone Encounter (Signed)
 Mychart msg sent to patient to schedule appt

## 2023-12-21 ENCOUNTER — Other Ambulatory Visit (HOSPITAL_BASED_OUTPATIENT_CLINIC_OR_DEPARTMENT_OTHER): Payer: Self-pay | Admitting: Family Medicine

## 2023-12-21 DIAGNOSIS — G43709 Chronic migraine without aura, not intractable, without status migrainosus: Secondary | ICD-10-CM

## 2024-01-14 ENCOUNTER — Ambulatory Visit (INDEPENDENT_AMBULATORY_CARE_PROVIDER_SITE_OTHER): Admitting: Family Medicine

## 2024-01-14 VITALS — BP 117/78 | HR 82 | Ht 59.0 in | Wt 126.7 lb

## 2024-01-14 DIAGNOSIS — Z0000 Encounter for general adult medical examination without abnormal findings: Secondary | ICD-10-CM | POA: Diagnosis not present

## 2024-01-14 DIAGNOSIS — Z1329 Encounter for screening for other suspected endocrine disorder: Secondary | ICD-10-CM | POA: Diagnosis not present

## 2024-01-14 DIAGNOSIS — Z131 Encounter for screening for diabetes mellitus: Secondary | ICD-10-CM | POA: Diagnosis not present

## 2024-01-14 NOTE — Assessment & Plan Note (Signed)
 Routine HCM labs ordered. HCM reviewed/discussed. Anticipatory guidance regarding healthy weight, lifestyle and choices given. Recommend healthy diet.  Recommend approximately 150 minutes/week of moderate intensity exercise Recommend regular dental and vision exams Always use seatbelt/lap and shoulder restraints Recommend using smoke alarms and checking batteries at least twice a year Recommend using sunscreen when outside She is currently up-to-date on cervical cancer screening, sees Dr. Annabell Key Up-to-date on tetanus

## 2024-01-14 NOTE — Progress Notes (Signed)
 Subjective:    CC: Annual Physical Exam  HPI:  Veronica Shea is a 33 y.o. presenting for annual physical  I reviewed the past medical history, family history, social history, surgical history, and allergies today and no changes were needed.  Please see the problem list section below in epic for further details.  Past Medical History: Past Medical History:  Diagnosis Date   Genital herpes    Past Surgical History: Past Surgical History:  Procedure Laterality Date   NO PAST SURGERIES     TUBAL LIGATION Bilateral 05/24/2020   Procedure: POST PARTUM TUBAL LIGATION;  Surgeon: Janeane Mealy, MD;  Location: [facility];  Service: Gynecology;  Laterality: Bilateral;   Social History: Social History   Socioeconomic History   Marital status: Single    Spouse name: Not on file   Number of children: Not on file   Years of education: Not on file   Highest education level: GED or equivalent  Occupational History   Not on file  Tobacco Use   Smoking status: Some Days    Current packs/day: 0.10    Types: Cigarettes    Passive exposure: Current   Smokeless tobacco: Never  Vaping Use   Vaping status: Never Used  Substance and Sexual Activity   Alcohol use: Yes    Alcohol/week: 1.0 standard drink of alcohol    Types: 1 Cans of beer per week    Comment: Occasionally   Drug use: No   Sexual activity: Yes    Birth control/protection: Surgical  Other Topics Concern   Not on file  Social History Narrative   Not on file   Social Drivers of Health   Financial Resource Strain: Medium Risk (01/14/2024)   Overall Financial Resource Strain (CARDIA)    Difficulty of Paying Living Expenses: Somewhat hard  Food Insecurity: Food Insecurity Present (01/14/2024)   Hunger Vital Sign    Worried About Running Out of Food in the Last Year: Sometimes true    Ran Out of Food in the Last Year: Sometimes true  Transportation Needs: No Transportation Needs (01/14/2024)   PRAPARE -  Administrator, Civil Service (Medical): No    Lack of Transportation (Non-Medical): No  Physical Activity: Insufficiently Active (01/14/2024)   Exercise Vital Sign    Days of Exercise per Week: 3 days    Minutes of Exercise per Session: 20 min  Stress: No Stress Concern Present (01/14/2024)   [employer] of Occupational Health - Occupational Stress Questionnaire    Feeling of Stress : Only a little  Social Connections: Moderately Isolated (01/14/2024)   Social Connection and Isolation Panel [NHANES]    Frequency of Communication with Friends and Family: Once a week    Frequency of Social Gatherings with Friends and Family: Once a week    Attends Religious Services: 1 to 4 times per year    Active Member of [employer] or Organizations: No    Attends Engineer, structural: Not on file    Marital Status: Living with partner   Family History: Family History  Problem Relation Age of Onset   Diabetes Paternal Grandmother    Hypertension Father    Diabetes Father    Diabetes Sister    Allergies: No Known Allergies Medications: See med rec.  Review of Systems: No headache, visual changes, nausea, vomiting, diarrhea, constipation, dizziness, abdominal pain, skin rash, fevers, chills, night sweats, swollen lymph nodes, weight loss, chest pain, body aches, joint swelling, muscle  aches, shortness of breath, mood changes, visual or auditory hallucinations.  Objective:    BP 117/78 (BP Location: Right Arm, Patient Position: Sitting, Cuff Size: Normal)   Pulse 82   Ht 4' 11 (1.499 m)   Wt 126 lb 11.2 oz (57.5 kg)   LMP 12/29/2023 (Approximate)   SpO2 96%   BMI 25.59 kg/m   General: Well Developed, well nourished, and in no acute distress. Neuro: Alert and oriented x3, extra-ocular muscles intact, sensation grossly intact. Cranial nerves II through XII are intact, motor, sensory, and coordinative functions are all intact. HEENT: Normocephalic, atraumatic, pupils equal  round reactive to light, neck supple, no masses, no lymphadenopathy, thyroid  nonpalpable. Oropharynx, nasopharynx, external ear canals are unremarkable. Skin: Warm and dry, no rashes noted.  Cardiac: Regular rate and rhythm, no murmurs rubs or gallops.  Respiratory: Clear to auscultation bilaterally. Not using accessory muscles, speaking in full sentences.  Abdominal: Soft, nontender, nondistended, positive bowel sounds, no masses, no organomegaly. Musculoskeletal: Shoulder, elbow, wrist, hip, knee, ankle stable, and with full range of motion.  Impression and Recommendations:    Wellness examination Assessment & Plan: Routine HCM labs ordered. HCM reviewed/discussed. Anticipatory guidance regarding healthy weight, lifestyle and choices given. Recommend healthy diet.  Recommend approximately 150 minutes/week of moderate intensity exercise Recommend regular dental and vision exams Always use seatbelt/lap and shoulder restraints Recommend using smoke alarms and checking batteries at least twice a year Recommend using sunscreen when outside She is currently up-to-date on cervical cancer screening, sees Dr. Annabell Key Up-to-date on tetanus  Orders: -     TSH Rfx on Abnormal to Free T4 -     Lipid panel -     Hemoglobin A1c -     Comprehensive metabolic panel with GFR -     CBC with Differential/Platelet  Return in about 1 year (around 01/13/2025) for CPE.   ___________________________________________ Duilio Heritage de [place], MD, ABFM, CAQSM Primary Care and Sports Medicine [facility]

## 2024-01-14 NOTE — Patient Instructions (Signed)
   Medication Instructions:  Your physician recommends that you continue on your current medications as directed. Please refer to the Current Medication list given to you today. --If you need a refill on any your medications before your next appointment, please call your pharmacy first. If no refills are authorized on file call the office.-- Lab Work: Your physician has recommended that you have lab work today: today If you have labs (blood work) drawn today and your tests are completely normal, you will receive your results via MyChart message OR a phone call from our staff.  Please ensure you check your voicemail in the event that you authorized detailed messages to be left on a delegated number. If you have any lab test that is abnormal or we need to change your treatment, we will call you to review the results.    Follow-Up: Your next appointment:   Your physician recommends that you schedule a follow-up appointment in: 1 year physical with Dr. de [place]  You will receive a text message or e-mail with a link to a survey about your care and experience with [place] today! We would greatly appreciate your feedback!   Thanks for letting us be apart of your health journey!!  Primary Care and Sports Medicine   Dr. Ceasar Mons [place]   We encourage you to activate your patient portal called "MyChart".  Sign up information is provided on this After Visit Summary.  MyChart is used to connect with patients for Virtual Visits (Telemedicine).  Patients are able to view lab/test results, encounter notes, upcoming appointments, etc.  Non-urgent messages can be sent to your provider as well. To learn more about what you can do with MyChart, please visit --  [email or web address].

## 2024-01-15 LAB — LIPID PANEL
Chol/HDL Ratio: 4.2 ratio (ref 0.0–4.4)
Cholesterol, Total: 159 mg/dL (ref 100–199)
HDL: 38 mg/dL — ABNORMAL LOW (ref >39)
LDL Chol Calc (NIH): 100 mg/dL — ABNORMAL HIGH (ref 0–99)
Triglycerides: 116 mg/dL (ref 0–149)
VLDL Cholesterol Cal: 21 mg/dL (ref 5–40)

## 2024-01-15 LAB — COMPREHENSIVE METABOLIC PANEL WITH GFR
ALT: 6 IU/L (ref 0–32)
AST: 11 IU/L (ref 0–40)
Albumin: 4.5 g/dL (ref 3.9–4.9)
Alkaline Phosphatase: 108 IU/L (ref 44–121)
BUN/Creatinine Ratio: 24 — ABNORMAL HIGH (ref 9–23)
BUN: 15 mg/dL (ref 6–20)
Bilirubin Total: 0.7 mg/dL (ref 0.0–1.2)
CO2: 17 mmol/L — ABNORMAL LOW (ref 20–29)
Calcium: 9.1 mg/dL (ref 8.7–10.2)
Chloride: 104 mmol/L (ref 96–106)
Creatinine, Ser: 0.63 mg/dL (ref 0.57–1.00)
Globulin, Total: 2.4 g/dL (ref 1.5–4.5)
Glucose: 91 mg/dL (ref 70–99)
Potassium: 4.4 mmol/L (ref 3.5–5.2)
Sodium: 139 mmol/L (ref 134–144)
Total Protein: 6.9 g/dL (ref 6.0–8.5)
eGFR: 119 mL/min/{1.73_m2} (ref >59)

## 2024-01-15 LAB — CBC WITH DIFFERENTIAL/PLATELET
Basophils Absolute: 0 10*3/uL (ref 0.0–0.2)
Basos: 0 %
EOS (ABSOLUTE): 0.1 10*3/uL (ref 0.0–0.4)
Eos: 1 %
Hematocrit: 42.6 % (ref 34.0–46.6)
Hemoglobin: 14 g/dL (ref 11.1–15.9)
Immature Grans (Abs): 0 10*3/uL (ref 0.0–0.1)
Immature Granulocytes: 0 %
Lymphocytes Absolute: 2.5 10*3/uL (ref 0.7–3.1)
Lymphs: 34 %
MCH: 29.5 pg (ref 26.6–33.0)
MCHC: 32.9 g/dL (ref 31.5–35.7)
MCV: 90 fL (ref 79–97)
Monocytes Absolute: 0.4 10*3/uL (ref 0.1–0.9)
Monocytes: 6 %
Neutrophils Absolute: 4.2 10*3/uL (ref 1.4–7.0)
Neutrophils: 59 %
Platelets: 299 10*3/uL (ref 150–450)
RBC: 4.74 x10E6/uL (ref 3.77–5.28)
RDW: 13 % (ref 11.7–15.4)
WBC: 7.2 10*3/uL (ref 3.4–10.8)

## 2024-01-15 LAB — HEMOGLOBIN A1C
Est. average glucose Bld gHb Est-mCnc: 100 mg/dL
Hgb A1c MFr Bld: 5.1 % (ref 4.8–5.6)

## 2024-01-15 LAB — TSH RFX ON ABNORMAL TO FREE T4: TSH: 0.62 u[IU]/mL (ref 0.450–4.500)

## 2024-01-17 ENCOUNTER — Other Ambulatory Visit (HOSPITAL_BASED_OUTPATIENT_CLINIC_OR_DEPARTMENT_OTHER): Payer: Self-pay | Admitting: Family Medicine

## 2024-01-17 DIAGNOSIS — G43709 Chronic migraine without aura, not intractable, without status migrainosus: Secondary | ICD-10-CM

## 2024-01-20 ENCOUNTER — Ambulatory Visit (HOSPITAL_BASED_OUTPATIENT_CLINIC_OR_DEPARTMENT_OTHER): Payer: Self-pay | Admitting: Family Medicine

## 2024-02-25 ENCOUNTER — Encounter (HOSPITAL_BASED_OUTPATIENT_CLINIC_OR_DEPARTMENT_OTHER): Payer: Self-pay | Admitting: Family Medicine

## 2024-02-26 ENCOUNTER — Other Ambulatory Visit (HOSPITAL_BASED_OUTPATIENT_CLINIC_OR_DEPARTMENT_OTHER): Payer: Self-pay | Admitting: *Deleted

## 2024-02-26 MED ORDER — VALACYCLOVIR HCL 500 MG PO TABS: 500.0000 mg | ORAL_TABLET | Freq: Two times a day (BID) | ORAL | 3 refills | Status: AC

## 2024-03-29 ENCOUNTER — Ambulatory Visit (HOSPITAL_BASED_OUTPATIENT_CLINIC_OR_DEPARTMENT_OTHER): Admitting: Obstetrics & Gynecology

## 2024-04-12 ENCOUNTER — Ambulatory Visit (HOSPITAL_BASED_OUTPATIENT_CLINIC_OR_DEPARTMENT_OTHER): Admitting: Obstetrics & Gynecology

## 2024-04-12 ENCOUNTER — Encounter (HOSPITAL_BASED_OUTPATIENT_CLINIC_OR_DEPARTMENT_OTHER): Payer: Self-pay | Admitting: Obstetrics & Gynecology

## 2024-04-12 ENCOUNTER — Other Ambulatory Visit (HOSPITAL_COMMUNITY)
Admission: RE | Admit: 2024-04-12 | Discharge: 2024-04-12 | Disposition: A | Discharge status: Home / Self Care | Source: Ambulatory Visit | Attending: Obstetrics & Gynecology | Admitting: Obstetrics & Gynecology

## 2024-04-12 VITALS — BP 113/69 | HR 58 | Wt 129.8 lb

## 2024-04-12 DIAGNOSIS — Z8619 Personal history of other infectious and parasitic diseases: Secondary | ICD-10-CM | POA: Diagnosis not present

## 2024-04-12 DIAGNOSIS — Z113 Encounter for screening for infections with a predominantly sexual mode of transmission: Secondary | ICD-10-CM | POA: Diagnosis not present

## 2024-04-12 DIAGNOSIS — N760 Acute vaginitis: Secondary | ICD-10-CM | POA: Insufficient documentation

## 2024-04-12 DIAGNOSIS — B9689 Other specified bacterial agents as the cause of diseases classified elsewhere: Secondary | ICD-10-CM | POA: Insufficient documentation

## 2024-04-12 DIAGNOSIS — N930 Postcoital and contact bleeding: Secondary | ICD-10-CM | POA: Diagnosis present

## 2024-04-12 DIAGNOSIS — Z01419 Encounter for gynecological examination (general) (routine) without abnormal findings: Secondary | ICD-10-CM | POA: Diagnosis not present

## 2024-04-12 DIAGNOSIS — R3915 Urgency of urination: Secondary | ICD-10-CM | POA: Diagnosis not present

## 2024-04-12 NOTE — Addendum Note (Signed)
 Addended by: CLEOTILDE RONAL RAMAN on: 04/12/2024 05:38 PM   Modules accepted: Orders

## 2024-04-12 NOTE — Progress Notes (Signed)
 CC:   bleeding with intercourse  HPI: 33 y.o. G77P3003 Single White or Caucasian female here for bleeding following intercourse that started last month.  It was bright red when it occurred.  She has had intercourse since then and has not had this.  Has been together with her partner for almost 12 years.   She is, unrelated she thinks, having a lot of bloated.  She has been asked if she is pregnant.  Typically she has regular bowel movements and usually daily.  Typically she does not have diarrhea and does not have constipation.    H/o HSV and she had outbreak last week.  Has RFs for valtrex .    She is having increased urinary urgency.  Denies fever.  Denies dysuria.    She is cramping some today.  Started cycle this morning.  Not keeping exact dates as she underwent tubal ligation in 2021.  Did have cycle in early August.     Past Medical History:  Diagnosis Date   Genital herpes     MEDS:   Current Outpatient Medications on File Prior to Visit  Medication Sig Dispense Refill   SUMAtriptan  (IMITREX ) 50 MG tablet TAKE 1 TAB BY MOUTH ONCE AS NEEDED FOR UP TO 1 DOSE FOR MIGRAINE MAY REPEAT IN 2 HOURS IF STILL EXPERIENCING HEADACHE. DO NOT TAKE MORE THAN 2TABS/24 HRS 12 tablet 1   valACYclovir  (VALTREX ) 500 MG tablet Take 1 tablet (500 mg total) by mouth 2 (two) times daily. 6 tablet 3   No current facility-administered medications on file prior to visit.    ALLERGIES: Patient has no known allergies.  SH:  partnered, non smoker  Review of Systems  Constitutional: Negative.   Genitourinary: Negative.     PHYSICAL EXAMINATION:    BP 113/69 (BP Location: Left Arm, Patient Position: Sitting, Cuff Size: Normal)   Pulse (!) 58   Wt 129 lb 12.8 oz (58.9 kg)   LMP  (LMP Unknown)   SpO2 100%   BMI 26.22 kg/m     General appearance: alert, cooperative and appears stated age Abdomen: soft, non-tender; bowel sounds normal; no masses,  no organomegaly Lymph:  no inguinal LAD  noted  Pelvic: External genitalia:  no lesions              Urethra:  normal appearing urethra with no masses, tenderness or lesions              Bartholins and Skenes: normal                 Vagina: normal mucosa without prolapse or lesions              Cervix: no lesions              Bimanual Exam:  Uterus:  normal size, contour, position, consistency, mobility, non-tender              Adnexa: no mass, fullness, tenderness  Chaperone, Kaitlyn Sprague, RN, was present for exam.  Assessment/Plan: 1. Postcoital bleeding (Primary) - will repeat pap today and check for vaginitis.  Pt does have hx of positive BV with multiple tests in the past - she is aware that with bleeding today it is hard to determine if she is actually having bleeding from her cervix.  She may need to return for additional examination when she is not bleeding depending on results - Cytology - PAP( [facility]) - Cervicovaginal ancillary only( )  2. Urinary  urgency - will check urine culture today for infection - Urine Culture  3.  H/o HSV - pt does not need RF for valtrex  at this time

## 2024-04-13 ENCOUNTER — Ambulatory Visit (HOSPITAL_BASED_OUTPATIENT_CLINIC_OR_DEPARTMENT_OTHER): Payer: Self-pay | Admitting: Obstetrics & Gynecology

## 2024-04-13 LAB — CERVICOVAGINAL ANCILLARY ONLY
Bacterial Vaginitis (gardnerella): POSITIVE — AB
Candida Glabrata: NEGATIVE
Candida Vaginitis: NEGATIVE
Chlamydia: NEGATIVE
Comment: NEGATIVE
Comment: NEGATIVE
Comment: NEGATIVE
Comment: NEGATIVE
Comment: NEGATIVE
Comment: NORMAL
Neisseria Gonorrhea: NEGATIVE
Trichomonas: NEGATIVE

## 2024-04-13 MED ORDER — METRONIDAZOLE 500 MG PO TABS: 500.0000 mg | ORAL_TABLET | Freq: Two times a day (BID) | ORAL | 0 refills | Status: AC

## 2024-04-14 LAB — URINE CULTURE

## 2024-04-14 LAB — CYTOLOGY - PAP
Adequacy: ABSENT
Diagnosis: NEGATIVE

## 2024-05-14 ENCOUNTER — Other Ambulatory Visit (HOSPITAL_BASED_OUTPATIENT_CLINIC_OR_DEPARTMENT_OTHER): Payer: Self-pay | Admitting: Family Medicine

## 2024-05-14 DIAGNOSIS — G43709 Chronic migraine without aura, not intractable, without status migrainosus: Secondary | ICD-10-CM

## 2024-07-13 ENCOUNTER — Ambulatory Visit (HOSPITAL_BASED_OUTPATIENT_CLINIC_OR_DEPARTMENT_OTHER): Admitting: Obstetrics & Gynecology

## 2024-07-13 ENCOUNTER — Encounter (HOSPITAL_BASED_OUTPATIENT_CLINIC_OR_DEPARTMENT_OTHER): Payer: Self-pay | Admitting: Obstetrics & Gynecology

## 2024-07-13 VITALS — BP 117/77 | HR 69 | Wt 134.6 lb

## 2024-07-13 DIAGNOSIS — N644 Mastodynia: Secondary | ICD-10-CM | POA: Diagnosis not present

## 2024-07-13 NOTE — Patient Instructions (Signed)
Call [phone] to scheduled at [facility], [address], [address].  [place], [place] [address]   

## 2024-07-13 NOTE — Progress Notes (Unsigned)
° °  GYNECOLOGY  VISIT  CC:   Breast Problem   HPI: 33 y.o. G28P3003 Single White or Caucasian female here for intermittent right breast pain that began 3 weeks ago.  Denies any swelling, redness, or nipple discharge.  Denies any recent trauma to the breast.  Drinks caffeine.  Will have two cups of coffee each day but no recent change.   Does not typically wear a bra.    Patient's last menstrual period was 06/12/2024 (approximate).  Past Medical History:  Diagnosis Date   Genital herpes     MEDS:  Reviewed in EPIC  ALLERGIES: Patient has no known allergies.  SH:  single, non smoker  Review of Systems  Constitutional: Negative.     PHYSICAL EXAMINATION:    BP 117/77 (BP Location: Left Arm, Patient Position: Sitting, Cuff Size: Normal)   Pulse 69   Wt 134 lb 9.6 oz (61.1 kg)   LMP 06/12/2024 (Approximate)   SpO2 95%   BMI 27.19 kg/m     Physical Exam Constitutional:      Appearance: Normal appearance.  Chest:    Neurological:     General: No focal deficit present.     Mental Status: She is alert.  Psychiatric:        Mood and Affect: Mood normal.     Chaperone was present for exam.  Assessment/Plan: 1. Breast pain, right (Primary) - given age, will need to check with radiology about ultrasound only or diagnostic mammogram with ultrasound.  Physical exam findings reviewed as well.  Pt aware we will communicate with her about what radiology recommends. - MM 3D DIAGNOSTIC MAMMOGRAM UNILATERAL RIGHT BREAST; Future - US  LIMITED ULTRASOUND INCLUDING AXILLA RIGHT BREAST; Future

## 2024-08-17 ENCOUNTER — Ambulatory Visit
Admission: RE | Admit: 2024-08-17 | Discharge: 2024-08-17 | Disposition: A | Source: Ambulatory Visit | Attending: Obstetrics & Gynecology | Admitting: Obstetrics & Gynecology

## 2024-08-17 DIAGNOSIS — N644 Mastodynia: Secondary | ICD-10-CM

## 2024-09-08 ENCOUNTER — Encounter (HOSPITAL_BASED_OUTPATIENT_CLINIC_OR_DEPARTMENT_OTHER): Payer: Self-pay | Admitting: Family Medicine

## 2024-09-08 NOTE — Telephone Encounter (Signed)
 Please see mychart message sent by pt and advise.

## 2025-01-14 ENCOUNTER — Encounter (HOSPITAL_BASED_OUTPATIENT_CLINIC_OR_DEPARTMENT_OTHER): Admitting: Family Medicine
